# Patient Record
Sex: Male | Born: 1950 | Race: White | Hispanic: No | Marital: Single | Smoking: Former smoker
Health system: Southern US, Community
[De-identification: ages and names within clinical notes are randomized; demographics above are authoritative.]

## PROBLEM LIST (undated history)

## (undated) DIAGNOSIS — I4892 Unspecified atrial flutter: Secondary | ICD-10-CM

## (undated) DIAGNOSIS — I499 Cardiac arrhythmia, unspecified: Secondary | ICD-10-CM

## (undated) DIAGNOSIS — K6282 Dysplasia of anus: Secondary | ICD-10-CM

## (undated) DIAGNOSIS — M1990 Unspecified osteoarthritis, unspecified site: Secondary | ICD-10-CM

## (undated) DIAGNOSIS — L80 Vitiligo: Secondary | ICD-10-CM

## (undated) DIAGNOSIS — I4891 Unspecified atrial fibrillation: Secondary | ICD-10-CM

## (undated) HISTORY — DX: Vitiligo: L80

## (undated) HISTORY — DX: Dysplasia of anus: K62.82

## (undated) HISTORY — PX: ANUS SURGERY: SHX302

---

## 2004-07-07 HISTORY — PX: COLONOSCOPY: SHX174

## 2006-06-19 ENCOUNTER — Ambulatory Visit: Payer: Self-pay | Admitting: Family Medicine

## 2006-10-26 ENCOUNTER — Ambulatory Visit: Payer: Self-pay | Admitting: Family Medicine

## 2007-04-02 ENCOUNTER — Encounter (HOSPITAL_BASED_OUTPATIENT_CLINIC_OR_DEPARTMENT_OTHER): Payer: Self-pay | Admitting: General Surgery

## 2007-04-02 ENCOUNTER — Ambulatory Visit (HOSPITAL_COMMUNITY): Admission: RE | Admit: 2007-04-02 | Discharge: 2007-04-02 | Payer: Self-pay | Admitting: General Surgery

## 2007-08-25 ENCOUNTER — Ambulatory Visit: Payer: Self-pay | Admitting: Family Medicine

## 2008-02-07 ENCOUNTER — Ambulatory Visit: Payer: Self-pay | Admitting: Family Medicine

## 2008-07-07 HISTORY — PX: SIGMOIDOSCOPY: SUR1295

## 2009-08-13 ENCOUNTER — Ambulatory Visit: Payer: Self-pay | Admitting: Family Medicine

## 2010-11-04 ENCOUNTER — Ambulatory Visit (INDEPENDENT_AMBULATORY_CARE_PROVIDER_SITE_OTHER): Payer: BC Managed Care – PPO | Admitting: Family Medicine

## 2010-11-04 DIAGNOSIS — R000 Tachycardia, unspecified: Secondary | ICD-10-CM

## 2010-11-05 ENCOUNTER — Encounter: Payer: Self-pay | Admitting: Family Medicine

## 2010-11-19 NOTE — Op Note (Signed)
NAMEALIZE, ACY               ACCOUNT NO.:  [id number]   MEDICAL RECORD NO.:  [id number]          PATIENT TYPE:  AMB   LOCATION:  DAY                          FACILITY:  [facility]   PHYSICIAN:  Leonie Man, M.D.   DATE OF BIRTH:  1950-08-21   DATE OF PROCEDURE:  04/02/2007  DATE OF DISCHARGE:                               OPERATIVE REPORT   PREOPERATIVE DIAGNOSIS:  Anal intraepithelial neoplasia.   POSTOPERATIVE DIAGNOSIS:  Anal intraepithelial neoplasia.   PROCEDURE:  Excision of anal intraepithelial neoplasia, anal wall at 9  o'clock.   SURGEON:  Leonie Man, M.D.   ASSISTANT:  Sheppard Plumber. Earlene Plater, M.D.   ANESTHESIA:  General.   SURGICAL FINDINGS:  An area of mucosal abnormality consistent with AIN  at the 9 o'clock position with the patient in lithotomy position.   SPECIMENS:  Forwarded to pathology.   ESTIMATED BLOOD LOSS:  minimal.   COMPLICATIONS:  None.   Patient returned to the PACU in excellent condition.   INDICATIONS:  The patient is a 60 year old Caucasian male who on initial  presentation underwent colonoscopy by Dr. Jeani Hawking, who saw  an  abnornal area within the ano-rectum which, on biopsy, showed low-grade  intraepithelial neoplasia.  The patient was followed expectantly and  when he returned in 2008, on repeat flexible sigmoidoscopy he was re-  biopsied and now noted to have an area of high-grade intraepithelial  neoplasia.  The patient comes to the operating room now for wide local  excision of this area after the risks and potential benefits of surgery  have been discussed.  We have also discussed the possibility of using  laser ablation to evaluate this to further treat this area.   PROCEDURE:  The patient is positioned supinely, then placed in lithotomy  position.  Perianal tissues are prepped and draped to be included in a  sterile operative field.  A perianal block is carried out with Marcaine  with epinephrine and the anal orifice was  dilated up to 3  fingerbreadths.  The scope is placed in and at the region at 9 o'clock  with the patient in lithotomy, there was an area of what appeared to be  dysplastic.  This area was infiltrated with Marcaine with epinephrine to  raise the mucosa up and then using electrocautery, an area of mucosa was  taken out and dissected free from the underlying internal sphincter  muscles and removed in its entirety and forwarded for pathologic  evaluation.  The hemostasis was controlled with electrocautery and the  resulting incision closed with a running suture of 2-0 chromic catgut.  There was no bleeding at the end of this; however, I did place a Gelfoam  plug  within the anus for additional hemostasis.  Sponge and instrument counts  were verified, the anesthetic reversed and the patient removed from the  operating room to the recovery room in stable condition.  He tolerated  the procedure well.      Leonie Man, M.D.  Electronically Signed     PB/MEDQ  D:  04/02/2007  T:  04/03/2007  Job:  [id number] 

## 2010-11-25 ENCOUNTER — Ambulatory Visit (INDEPENDENT_AMBULATORY_CARE_PROVIDER_SITE_OTHER): Payer: BC Managed Care – PPO | Admitting: Family Medicine

## 2010-11-25 ENCOUNTER — Encounter: Payer: Self-pay | Admitting: Family Medicine

## 2010-11-25 VITALS — BP 122/90 | HR 54 | Ht 73.2 in | Wt 179.0 lb

## 2010-11-25 DIAGNOSIS — K6282 Dysplasia of anus: Secondary | ICD-10-CM

## 2010-11-25 DIAGNOSIS — K922 Gastrointestinal hemorrhage, unspecified: Secondary | ICD-10-CM

## 2010-11-25 DIAGNOSIS — Z0000 Encounter for general adult medical examination without abnormal findings: Secondary | ICD-10-CM

## 2010-11-25 LAB — POCT URINALYSIS DIPSTICK
Leukocytes, UA: NEGATIVE
Nitrite, UA: NEGATIVE
Protein, UA: NEGATIVE
Urobilinogen, UA: NEGATIVE
pH, UA: 7

## 2010-11-25 NOTE — Progress Notes (Signed)
  Subjective:    Patient ID: Kenneth Romero, male    DOB: 1951-04-17, 60 y.o.   MRN: [id number]  HPI he is here for a complete examination. He recently had an echocardiogram done to evaluate his one episode of tachycardia. He continues to have slight ringing in his ears but finds that it does not bother him enough that pursue further testing. He continues to have nocturia x1 and is comfortable with this. He continues on multiple medications mainly over-the-counter. These were discussed with him in detail. His social and family history were reviewed. Continues to work and this seems to be going well. 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   HENT: Negative.   Eyes: Negative.   Respiratory: Negative.   Cardiovascular: Negative.   Gastrointestinal: Negative.   Musculoskeletal: Negative.   Skin: Negative.   Neurological: Negative.   Psychiatric/Behavioral: Negative.        Objective:   Physical ExamBP 122/90  Pulse 54  Ht 6' 1.2" (1.859 m)  Wt 179 lb (81.194 kg)  BMI 23.49 kg/m2  General Appearance:    Alert, cooperative, no distress, appears stated age  Head:    Normocephalic, without obvious abnormality, atraumatic  Eyes:    PERRL, conjunctiva/corneas clear, EOM's intact, fundi    benign  Ears:    Normal TM's and external ear canals  Nose:   Nares normal, mucosa normal, no drainage or sinus   tenderness  Throat:   Lips, mucosa, and tongue normal; teeth and gums normal  Neck:   Supple, no lymphadenopathy;  thyroid:  no   enlargement/tenderness/nodules; no carotid   bruit or JVD  Back:    Spine nontender, no curvature, ROM normal, no CVA     tenderness  Lungs:     Clear to auscultation bilaterally without wheezes, rales or     ronchi; respirations unlabored  Chest Wall:    No tenderness or deformity   Heart:    Regular rate and rhythm, S1 and S2 normal, no murmur, rub   or gallop  Breast Exam:    No chest wall tenderness,  masses or gynecomastia  Abdomen:     Soft, non-tender, nondistended, normoactive bowel sounds,    no masses, no hepatosplenomegaly  Genitalia:    Normal male external genitalia without lesions.  Testicles without masses.  No inguinal hernias.  Rectal:    Normal sphincter tone, no masses or tenderness; guaiac positive stool.  Prostate smooth, no nodules, not enlarged.  Extremities:   No clubbing, cyanosis or edema  Pulses:   2+ and symmetric all extremities  Skin:   Skin color, texture, turgor normal, no rashes or lesions  Lymph nodes:   Cervical, supraclavicular, and axillary nodes normal  Neurologic:   CNII-XII intact, normal strength, sensation and gait; reflexes 2+ and symmetric throughout          Psych:   Normal mood, affect, hygiene and grooming.     The echocardiogram was reviewed and is negative       Assessment & Plan:  One episode of tachycardia. AIN1 with guaiac-positive stool A message was left with Dr. Elnoria Howard to call me concerning followup on this. Also discussed multiple over-the-counter medications he is on an increase to use a multivitamin as well as vitamin D and omega-3.

## 2010-11-25 NOTE — Patient Instructions (Signed)
If you have further trouble with heart rate speeding up call me for further evaluation.

## 2011-04-17 LAB — CBC
Platelets: 228
RDW: 12.8

## 2011-04-17 LAB — COMPREHENSIVE METABOLIC PANEL
AST: 26
Albumin: 4.1
Alkaline Phosphatase: 51
BUN: 10
GFR calc Af Amer: >60
Potassium: 4
Sodium: 140
Total Protein: 5.8 — ABNORMAL LOW

## 2011-04-17 LAB — DIFFERENTIAL
Basophils Relative: 1
Monocytes Absolute: 0.4
Monocytes Relative: 9
Neutro Abs: 2.9

## 2011-05-16 ENCOUNTER — Ambulatory Visit (INDEPENDENT_AMBULATORY_CARE_PROVIDER_SITE_OTHER): Payer: BC Managed Care – PPO | Admitting: Family Medicine

## 2011-05-16 ENCOUNTER — Encounter: Payer: Self-pay | Admitting: Family Medicine

## 2011-05-16 VITALS — BP 132/82 | HR 66 | Temp 98.4°F | Wt 182.0 lb

## 2011-05-16 DIAGNOSIS — J209 Acute bronchitis, unspecified: Secondary | ICD-10-CM

## 2011-05-16 MED ORDER — AMOXICILLIN ER 775 MG PO TB24: 775.0000 mg | ORAL_TABLET | Freq: Every day | ORAL | Status: DC

## 2011-05-16 NOTE — Progress Notes (Signed)
  Subjective:    Patient ID: Kenneth Romero, male    DOB: 08-23-1950, 60 y.o.   MRN: [id number]  HPI He has a two-week history of started with a sore throat followed by dry cough is now intermittently productive. He's had some rhinorrhea but no earache or sinus congestion. No fever or chills. He does not smoke. He continues on medications listed in the chart.   Review of Systems     Objective:   Physical Exam alert and in no distress. Tympanic membranes and canals are normal. Throat is clear. Tonsils are normal. Neck is supple without adenopathy or thyromegaly. Cardiac exam shows a regular sinus rhythm without murmurs or gallops. Lungs are clear to auscultation.        Assessment & Plan:  Bronchitis I will treat him with Moxatag. He is to call if not better.

## 2011-05-16 NOTE — Patient Instructions (Signed)
Tylenol or Advil for aches and pains and try NyQuil at night to help with sleeping and coughing. If you are not fully back to normal when you finish this give me a call

## 2011-05-26 ENCOUNTER — Other Ambulatory Visit: Payer: Self-pay | Admitting: Internal Medicine

## 2011-05-26 ENCOUNTER — Telehealth: Payer: Self-pay | Admitting: Family Medicine

## 2011-05-26 MED ORDER — AMOXICILLIN ER 775 MG PO TB24: 775.0000 mg | ORAL_TABLET | Freq: Every day | ORAL | Status: AC

## 2011-05-26 NOTE — Telephone Encounter (Signed)
Call in the antibiotic for him. I think he was switched to Amoxil but check

## 2011-05-26 NOTE — Telephone Encounter (Signed)
Refilled the moxatag 775mg  24 hr, thru [facility]. #10 tab no refills.

## 2011-05-26 NOTE — Telephone Encounter (Signed)
Pt called he is 50% better but wants another round of antibiotics called to [facility].

## 2011-05-26 NOTE — Telephone Encounter (Signed)
[facility] it in moxatag 775mg  24 hr. #10. No refills

## 2012-04-30 ENCOUNTER — Encounter: Payer: Self-pay | Admitting: Internal Medicine

## 2012-05-07 ENCOUNTER — Ambulatory Visit (INDEPENDENT_AMBULATORY_CARE_PROVIDER_SITE_OTHER): Payer: BC Managed Care – PPO | Admitting: Family Medicine

## 2012-05-07 ENCOUNTER — Encounter: Payer: Self-pay | Admitting: Family Medicine

## 2012-05-07 VITALS — BP 120/80 | HR 58 | Ht 73.0 in | Wt 175.0 lb

## 2012-05-07 DIAGNOSIS — Z23 Encounter for immunization: Secondary | ICD-10-CM

## 2012-05-07 DIAGNOSIS — K6282 Dysplasia of anus: Secondary | ICD-10-CM | POA: Insufficient documentation

## 2012-05-07 DIAGNOSIS — Z0000 Encounter for general adult medical examination without abnormal findings: Secondary | ICD-10-CM

## 2012-05-07 LAB — LIPID PANEL
HDL: 54 mg/dL (ref >39)
LDL Cholesterol: 95 mg/dL (ref 0–99)
Triglycerides: 84 mg/dL (ref <150)
VLDL: 17 mg/dL (ref 0–40)

## 2012-05-07 LAB — CBC WITH DIFFERENTIAL/PLATELET
Basophils Absolute: 0 10*3/uL (ref 0.0–0.1)
Basophils Relative: 0 % (ref 0–1)
Hemoglobin: 15.1 g/dL (ref 13.0–17.0)
MCHC: 34.6 g/dL (ref 30.0–36.0)
Neutro Abs: 3.4 10*3/uL (ref 1.7–7.7)
Neutrophils Relative %: 59 % (ref 43–77)
RDW: 13.1 % (ref 11.5–15.5)

## 2012-05-07 LAB — COMPREHENSIVE METABOLIC PANEL
AST: 22 U/L (ref 0–37)
Albumin: 4.3 g/dL (ref 3.5–5.2)
Alkaline Phosphatase: 52 U/L (ref 39–117)
Potassium: 4.2 mEq/L (ref 3.5–5.3)
Sodium: 140 mEq/L (ref 135–145)
Total Protein: 6 g/dL (ref 6.0–8.3)

## 2012-05-07 NOTE — Progress Notes (Signed)
  Subjective:    Patient ID: Kenneth Romero, male    DOB: 1951-04-30, 61 y.o.   MRN: [id number]  HPI He is here for complete examination. He has noted more arthritic symptoms of stiffness especially in the morning but does find that you have does help with this. He gets followup on his eyes and apparently was told he might have early signs of cataract. He also gets regular followup on his AIN1. His work continues to go well. He does exercise regularly.   Review of Systems  Constitutional: Negative.   HENT: Negative.   Eyes: Negative.   Respiratory: Negative.   Cardiovascular: Negative.   Gastrointestinal: Negative.   Genitourinary: Negative.   Musculoskeletal: Negative.   Skin: Negative.   Neurological: Negative.   Hematological: Negative.   Psychiatric/Behavioral: Negative.        Objective:   Physical Exam BP 120/80  Pulse 58  Ht 6\' 1"  (1.854 m)  Wt 175 lb (79.379 kg)  BMI 23.09 kg/m2  General Appearance:    Alert, cooperative, no distress, appears stated age  Head:    Normocephalic, without obvious abnormality, atraumatic  Eyes:    PERRL, conjunctiva/corneas clear, EOM's intact, fundi    benign  Ears:    Normal TM's and external ear canals  Nose:   Nares normal, mucosa normal, no drainage or sinus   tenderness  Throat:   Lips, mucosa, and tongue normal; teeth and gums normal  Neck:   Supple, no lymphadenopathy;  thyroid:  no   enlargement/tenderness/nodules; no carotid   bruit or JVD  Back:    Spine nontender, no curvature, ROM normal, no CVA     tenderness  Lungs:     Clear to auscultation bilaterally without wheezes, rales or     ronchi; respirations unlabored  Chest Wall:    No tenderness or deformity   Heart:    Regular rate and rhythm, S1 and S2 normal, no murmur, rub   or gallop  Breast Exam:    No chest wall tenderness, masses or gynecomastia  Abdomen:     Soft, non-tender, nondistended, normoactive bowel sounds,    no masses, no hepatosplenomegaly  Genitalia:     Normal male external genitalia without lesions.  Testicles without masses.  No inguinal hernias.  Rectal:    deferred   Extremities:   No clubbing, cyanosis or edema  Pulses:   2+ and symmetric all extremities  Skin:   Skin color, texture, turgor normal, no rashes or lesions  Lymph nodes:   Cervical, supraclavicular, and axillary nodes normal  Neurologic:   CNII-XII intact, normal strength, sensation and gait; reflexes 2+ and symmetric throughout          Psych:   Normal mood, affect, hygiene and grooming.           Assessment & Plan:   1. Routine general medical examination at a health care facility  Flu vaccine greater than or equal to 3yo preservative free IM, Lipid panel, CBC with Differential, Comprehensive metabolic panel  2. AIN grade I     flu shot given with instructions on risk and benefits. I encouraged him to continue to take good care of himself. We also discussed retirement and he has no plans at this time. So discussed advanced directive.

## 2012-05-08 NOTE — Progress Notes (Signed)
Quick Note:  The blood work is normal ______ 

## 2012-09-13 ENCOUNTER — Ambulatory Visit
Admission: RE | Admit: 2012-09-13 | Discharge: 2012-09-13 | Disposition: A | Discharge status: Home / Self Care | Payer: BC Managed Care – PPO | Source: Ambulatory Visit | Attending: Family Medicine | Admitting: Family Medicine

## 2012-09-13 ENCOUNTER — Encounter: Payer: Self-pay | Admitting: Family Medicine

## 2012-09-13 ENCOUNTER — Ambulatory Visit (INDEPENDENT_AMBULATORY_CARE_PROVIDER_SITE_OTHER): Payer: BC Managed Care – PPO | Admitting: Family Medicine

## 2012-09-13 VITALS — BP 118/80 | HR 68 | Wt 174.0 lb

## 2012-09-13 DIAGNOSIS — M25519 Pain in unspecified shoulder: Secondary | ICD-10-CM

## 2012-09-13 DIAGNOSIS — S0121XA Laceration without foreign body of nose, initial encounter: Secondary | ICD-10-CM

## 2012-09-13 DIAGNOSIS — M25512 Pain in left shoulder: Secondary | ICD-10-CM

## 2012-09-13 DIAGNOSIS — S0120XA Unspecified open wound of nose, initial encounter: Secondary | ICD-10-CM

## 2012-09-13 NOTE — Patient Instructions (Signed)
I will call you with the results of the xray 

## 2012-09-13 NOTE — Progress Notes (Signed)
  Subjective:    Patient ID: Kenneth Romero, male    DOB: March 19, 1951, 62 y.o.   MRN: [id number]  HPI He has a one-month history of left shoulder pain that he notices especially when he abducts and externally rotates. He also wakes up at night with this. He has no history of injury to the shoulder. He is right-handed. He does note occasional snapping.he is a professor at [employer]. He also sustained a laceration to the left bridge of the nose from his cat.   Review of Systems     Objective:   Physical Exam 1 CM laceration is noted to the bridge of the nose on the left. It is not red or tender. Left shoulder exam shows no palpable pain but pain on abduction and external rotation. Supraspinatus testing was negative. No laxity noted. Hawkins and Neer's test did cause a clicking sensation.       Assessment & Plan:  Nasal laceration, initial encounter  Left shoulder pain - Plan: DG Shoulder Left  Conservative care for the nasal laceration. I will wait the results of the x-ray. Discussed possible orthopedic referral especially since he has a clicking sensation.

## 2012-09-13 NOTE — Progress Notes (Signed)
Quick Note:  Let him know that he has degenerative changes in his shoulder and I want him to see an orthopedic surgeon. Set that up ______

## 2012-09-17 ENCOUNTER — Encounter: Payer: Self-pay | Admitting: Family Medicine

## 2012-09-21 ENCOUNTER — Encounter: Payer: Self-pay | Admitting: Family Medicine

## 2012-09-21 ENCOUNTER — Ambulatory Visit
Admission: RE | Admit: 2012-09-21 | Discharge: 2012-09-21 | Disposition: A | Discharge status: Home / Self Care | Payer: BC Managed Care – PPO | Source: Ambulatory Visit | Attending: Family Medicine | Admitting: Family Medicine

## 2012-09-21 ENCOUNTER — Ambulatory Visit (INDEPENDENT_AMBULATORY_CARE_PROVIDER_SITE_OTHER): Payer: BC Managed Care – PPO | Admitting: Family Medicine

## 2012-09-21 VITALS — BP 110/70 | HR 66 | Wt 177.0 lb

## 2012-09-21 DIAGNOSIS — M25519 Pain in unspecified shoulder: Secondary | ICD-10-CM

## 2012-09-21 NOTE — Progress Notes (Signed)
  Subjective:    Patient ID: Kenneth Romero, male    DOB: Feb 13, 1951, 62 y.o.   MRN: [id number]  HPI Is here for consult concerning continued difficulty with shoulder pain. Now apparently he also has difficulty with the right shoulder. Was difficult to determine when it bothers him but it does cause him more trouble at night. He initially stated lying flat on his back causes pain but then stated that he was sometimes be awakened with pain in not be flat on his back. 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 He is set up to see orthopedics for evaluation of degenerative changes in his left shoulder.   Review of Systems     Objective:   Physical Exam No tenderness to palpation of the right shoulder. No laxity noted. Range of motion shows slight limitation with external rotation and abduction.       Assessment & Plan:  Shoulder pain, unspecified laterality - Plan: DG Shoulder Right x-rays were ordered. I recommended that he also discussed this with the orthopedic surgeon who he plans to see it in the month.

## 2012-09-22 NOTE — Progress Notes (Signed)
Quick Note:  CALLED TO INFORM PT OF X-RAY LEFT MESSAGE TO CALL ME BACK ______

## 2012-09-22 NOTE — Progress Notes (Signed)
Quick Note:  Pt informed verbalized understanding  ______

## 2013-05-12 ENCOUNTER — Other Ambulatory Visit: Payer: Self-pay

## 2013-11-04 ENCOUNTER — Ambulatory Visit (INDEPENDENT_AMBULATORY_CARE_PROVIDER_SITE_OTHER): Payer: BC Managed Care – PPO | Admitting: Family Medicine

## 2013-11-04 ENCOUNTER — Telehealth: Payer: Self-pay | Admitting: *Deleted

## 2013-11-04 ENCOUNTER — Encounter: Payer: Self-pay | Admitting: Family Medicine

## 2013-11-04 VITALS — BP 140/90 | HR 52 | Resp 16 | Wt 178.0 lb

## 2013-11-04 DIAGNOSIS — Z23 Encounter for immunization: Secondary | ICD-10-CM

## 2013-11-04 DIAGNOSIS — Z0000 Encounter for general adult medical examination without abnormal findings: Secondary | ICD-10-CM

## 2013-11-04 DIAGNOSIS — L989 Disorder of the skin and subcutaneous tissue, unspecified: Secondary | ICD-10-CM

## 2013-11-04 DIAGNOSIS — K6282 Dysplasia of anus: Secondary | ICD-10-CM

## 2013-11-04 DIAGNOSIS — H409 Unspecified glaucoma: Secondary | ICD-10-CM

## 2013-11-04 LAB — POCT URINALYSIS DIPSTICK
BILIRUBIN UA: NEGATIVE
Blood, UA: NEGATIVE
GLUCOSE UA: NEGATIVE
KETONES UA: NEGATIVE
LEUKOCYTES UA: NEGATIVE
NITRITE UA: NEGATIVE
PH UA: 8
Protein, UA: NEGATIVE
Spec Grav, UA: <=1.005
Urobilinogen, UA: NEGATIVE

## 2013-11-04 LAB — COMPREHENSIVE METABOLIC PANEL
ALBUMIN: 4.5 g/dL (ref 3.5–5.2)
ALK PHOS: 64 U/L (ref 39–117)
ALT: 17 U/L (ref 0–53)
AST: 19 U/L (ref 0–37)
BUN: 14 mg/dL (ref 6–23)
CALCIUM: 9.1 mg/dL (ref 8.4–10.5)
CHLORIDE: 102 meq/L (ref 96–112)
CO2: 28 mEq/L (ref 19–32)
Creat: 0.7 mg/dL (ref 0.50–1.35)
Glucose, Bld: 91 mg/dL (ref 70–99)
POTASSIUM: 4 meq/L (ref 3.5–5.3)
SODIUM: 137 meq/L (ref 135–145)
TOTAL PROTEIN: 6.3 g/dL (ref 6.0–8.3)
Total Bilirubin: 0.8 mg/dL (ref 0.2–1.2)

## 2013-11-04 LAB — CBC WITH DIFFERENTIAL/PLATELET
BASOS ABS: 0 10*3/uL (ref 0.0–0.1)
BASOS PCT: 0 % (ref 0–1)
Eosinophils Absolute: 0.2 10*3/uL (ref 0.0–0.7)
Eosinophils Relative: 4 % (ref 0–5)
HCT: 45.1 % (ref 39.0–52.0)
HEMOGLOBIN: 15.7 g/dL (ref 13.0–17.0)
LYMPHS PCT: 28 % (ref 12–46)
Lymphs Abs: 1.5 10*3/uL (ref 0.7–4.0)
MCH: 31.4 pg (ref 26.0–34.0)
MCHC: 34.8 g/dL (ref 30.0–36.0)
MCV: 90.2 fL (ref 78.0–100.0)
MONOS PCT: 11 % (ref 3–12)
Monocytes Absolute: 0.6 10*3/uL (ref 0.1–1.0)
NEUTROS ABS: 3.1 10*3/uL (ref 1.7–7.7)
NEUTROS PCT: 57 % (ref 43–77)
Platelets: 209 10*3/uL (ref 150–400)
RBC: 5 MIL/uL (ref 4.22–5.81)
RDW: 13.6 % (ref 11.5–15.5)
WBC: 5.5 10*3/uL (ref 4.0–10.5)

## 2013-11-04 LAB — LIPID PANEL
Cholesterol: 185 mg/dL (ref 0–200)
HDL: 63 mg/dL (ref >39)
LDL CALC: 109 mg/dL — AB (ref 0–99)
Total CHOL/HDL Ratio: 2.9 Ratio
Triglycerides: 63 mg/dL (ref <150)
VLDL: 13 mg/dL (ref 0–40)

## 2013-11-04 NOTE — Telephone Encounter (Signed)
Patient notified of his upcoming appointment with Dr. Benson Norway on 11-09-13 at 11:00am for sigmoidoscopy. His labs and office notes need to be sent once they are back. 11-04-13 jk

## 2013-11-04 NOTE — Progress Notes (Signed)
   Subjective:    Patient ID: Kenneth Romero, male    DOB: 1950/08/16, 63 y.o.   MRN: [id number]  HPI He is here for complete examination. He has enjoyed excellent health. He is on multiple OTC meds. He also uses eyedrops for his underlying glaucoma and is being followed for this. He keeps himself active. Smoking and drinking were reviewed. 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 He has no plans to retire. He has a previous history of AIN-1 with surgery in 2008. His last colonoscopy was 2012. He does have a lesion on his right upper chest that he would like evaluated. Otherwise he has no particular concerns or complaints.   Review of Systems  All other systems reviewed and are negative.      Objective:   Physical Exam BP 140/90  Pulse 52  Resp 16  Wt 178 lb (80.74 kg)  General Appearance:    Alert, cooperative, no distress, appears stated age  Head:    Normocephalic, without obvious abnormality, atraumatic  Eyes:    PERRL, conjunctiva/corneas clear, EOM's intact, fundi    benign  Ears:    Normal TM's and external ear canals  Nose:   Nares normal, mucosa normal, no drainage or sinus   tenderness  Throat:   Lips, mucosa, and tongue normal; teeth and gums normal  Neck:   Supple, no lymphadenopathy;  thyroid:  no   enlargement/tenderness/nodules; no carotid   bruit or JVD  Back:    Spine nontender, no curvature, ROM normal, no CVA     tenderness  Lungs:     Clear to auscultation bilaterally without wheezes, rales or     ronchi; respirations unlabored  Chest Wall:    No tenderness or deformity   Heart:    Regular rate and rhythm, S1 and S2 normal, no murmur, rub   or gallop  Breast Exam:    No chest wall tenderness, masses or gynecomastia  Abdomen:     Soft, non-tender, nondistended, normoactive bowel sounds,    no masses, no hepatosplenomegaly  Genitalia:  Deferred  Rectal:  deferred  Extremities:   No clubbing, cyanosis or edema  Pulses:   2+ and  symmetric all extremities  Skin:   Skin color, texture, turgor normal, slightly erythematous linear lesion of approximately 2 cm noted on the right anterior chest near the clavicle midportion. The vascularity is slightly irregular.   Lymph nodes:   Cervical, supraclavicular, and axillary nodes normal  Neurologic:   CNII-XII intact, normal strength, sensation and gait; reflexes 2+ and symmetric throughout          Psych:   Normal mood, affect, hygiene and grooming.          Assessment & Plan:  Routine general medical examination at a health care facility - Plan: Urinalysis Dipstick, Tdap vaccine greater than or equal to 7yo IM, CBC with Differential, Comprehensive metabolic panel, Lipid panel  Anal intraepithelial neoplasia I (AIN I) - Plan: Ambulatory referral to Gastroenterology  Skin lesion of chest wall  he will return for a punch biopsy.

## 2013-11-04 NOTE — Telephone Encounter (Signed)
Error

## 2013-11-08 ENCOUNTER — Ambulatory Visit: Payer: Self-pay | Admitting: Family Medicine

## 2013-11-15 ENCOUNTER — Encounter: Payer: Self-pay | Admitting: Family Medicine

## 2013-11-15 ENCOUNTER — Other Ambulatory Visit: Payer: Self-pay | Admitting: Family Medicine

## 2013-11-15 ENCOUNTER — Ambulatory Visit (INDEPENDENT_AMBULATORY_CARE_PROVIDER_SITE_OTHER): Payer: BC Managed Care – PPO | Admitting: Family Medicine

## 2013-11-15 VITALS — BP 128/78 | HR 64 | Wt 179.0 lb

## 2013-11-15 DIAGNOSIS — L989 Disorder of the skin and subcutaneous tissue, unspecified: Secondary | ICD-10-CM

## 2013-11-15 NOTE — Progress Notes (Signed)
   Subjective:    Patient ID: Kenneth Romero, male    DOB: September 25, 1950, 63 y.o.   MRN: [id number]  HPI He is here for punch biopsy of the skin lesion in the right upper chest just distal to the mid clavicle area.  Review of Systems     Objective:   Physical Exam An oval-shaped slightly pinkish lesion with irregularity is noted in this area. It was injected with Xylocaine and epinephrine.       Assessment & Plan:  Skin lesion of chest wall  a 2 mm punch biopsy was taken.

## 2013-11-17 ENCOUNTER — Encounter: Payer: Self-pay | Admitting: Family Medicine

## 2013-11-30 ENCOUNTER — Encounter: Payer: Self-pay | Admitting: Family Medicine

## 2013-11-30 ENCOUNTER — Other Ambulatory Visit: Payer: Self-pay | Admitting: Family Medicine

## 2013-11-30 ENCOUNTER — Ambulatory Visit (INDEPENDENT_AMBULATORY_CARE_PROVIDER_SITE_OTHER): Payer: BC Managed Care – PPO | Admitting: Family Medicine

## 2013-11-30 DIAGNOSIS — C44519 Basal cell carcinoma of skin of other part of trunk: Secondary | ICD-10-CM

## 2013-11-30 NOTE — Progress Notes (Signed)
   Subjective:    Patient ID: Kenneth Romero, male    DOB: March 10, 1951, 63 y.o.   MRN: [id number]  HPI He is here for excision of a basal cell carcinoma of the right upper chest. The lesion is approximately 1 x 2 cm in size.   Review of Systems     Objective:   Physical Exam 1 x 2 cm erythematous lesion is noted in the midportion of the right clavicle.       Assessment & Plan:  Basal cell carcinoma of anterior chest  the area was injected with Xylocaine and epinephrine. An elliptical excision was accomplished without difficulty. 5 5-0 Ethilon sutures were applied. He is to turn here in one week for removal.

## 2013-12-07 ENCOUNTER — Ambulatory Visit: Payer: BC Managed Care – PPO | Admitting: Family Medicine

## 2013-12-07 ENCOUNTER — Ambulatory Visit (INDEPENDENT_AMBULATORY_CARE_PROVIDER_SITE_OTHER): Payer: BC Managed Care – PPO | Admitting: Family Medicine

## 2013-12-07 DIAGNOSIS — Z4802 Encounter for removal of sutures: Secondary | ICD-10-CM

## 2013-12-07 NOTE — Progress Notes (Signed)
   Subjective:    Patient ID: Kenneth Romero, male    DOB: [birth year], 63 y.o.   MRN: [id number]  HPI He is here for suture removal. Review his record indicates that the cancer was removed with free margins.   Review of Systems     Objective:   Physical Exam One suture did fall out. The other 3 were removed. He does have evidence of a tissue reaction from the sutures.       Assessment & Plan:   suture removal. I reassured him that the erythema will diminish with time.

## 2014-09-20 IMAGING — CR DG SHOULDER 2+V*R*
3 series · 3 of 3 positions shown · non-contrast
Comparison: None.

CLINICAL DATA: Chronic right shoulder pain.

RIGHT SHOULDER - 2+ VIEW

[view not recorded (1 of 3)]
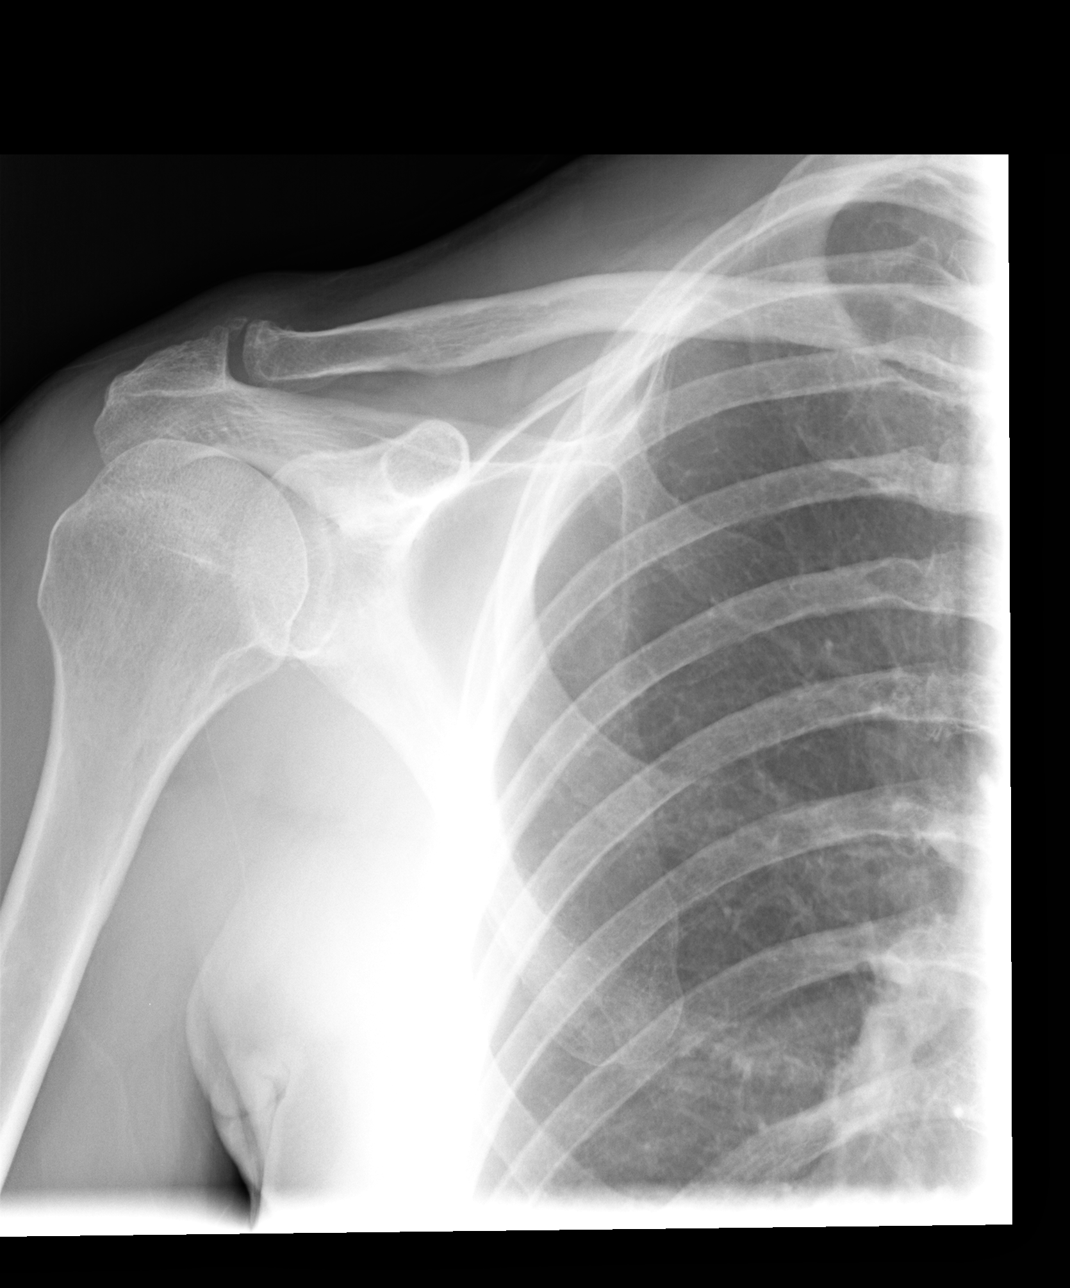

[view not recorded (2 of 3)]
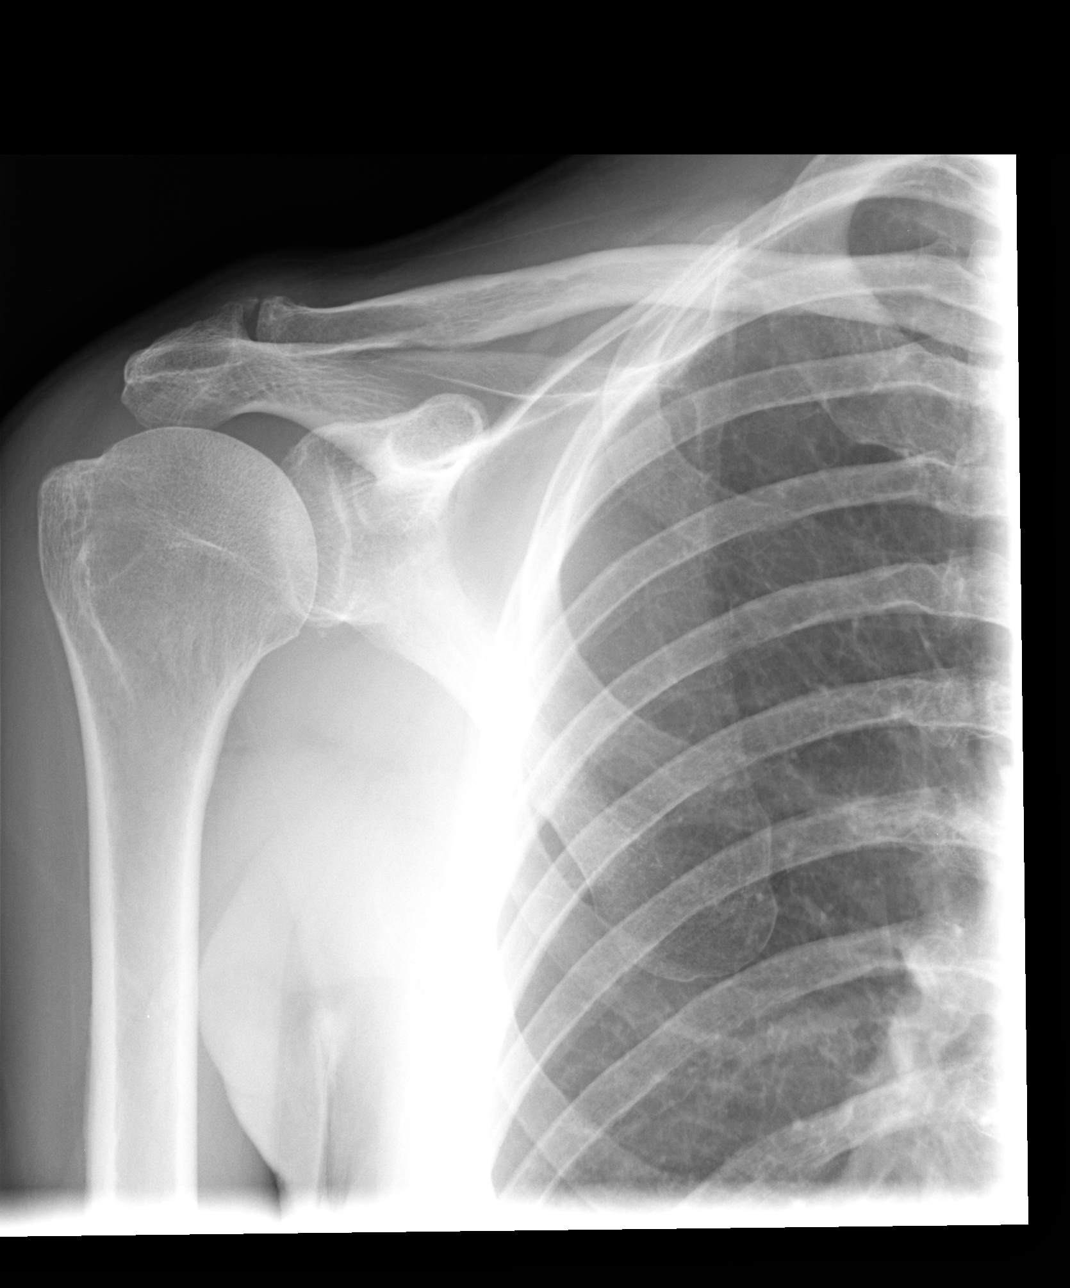

[view not recorded (3 of 3)]
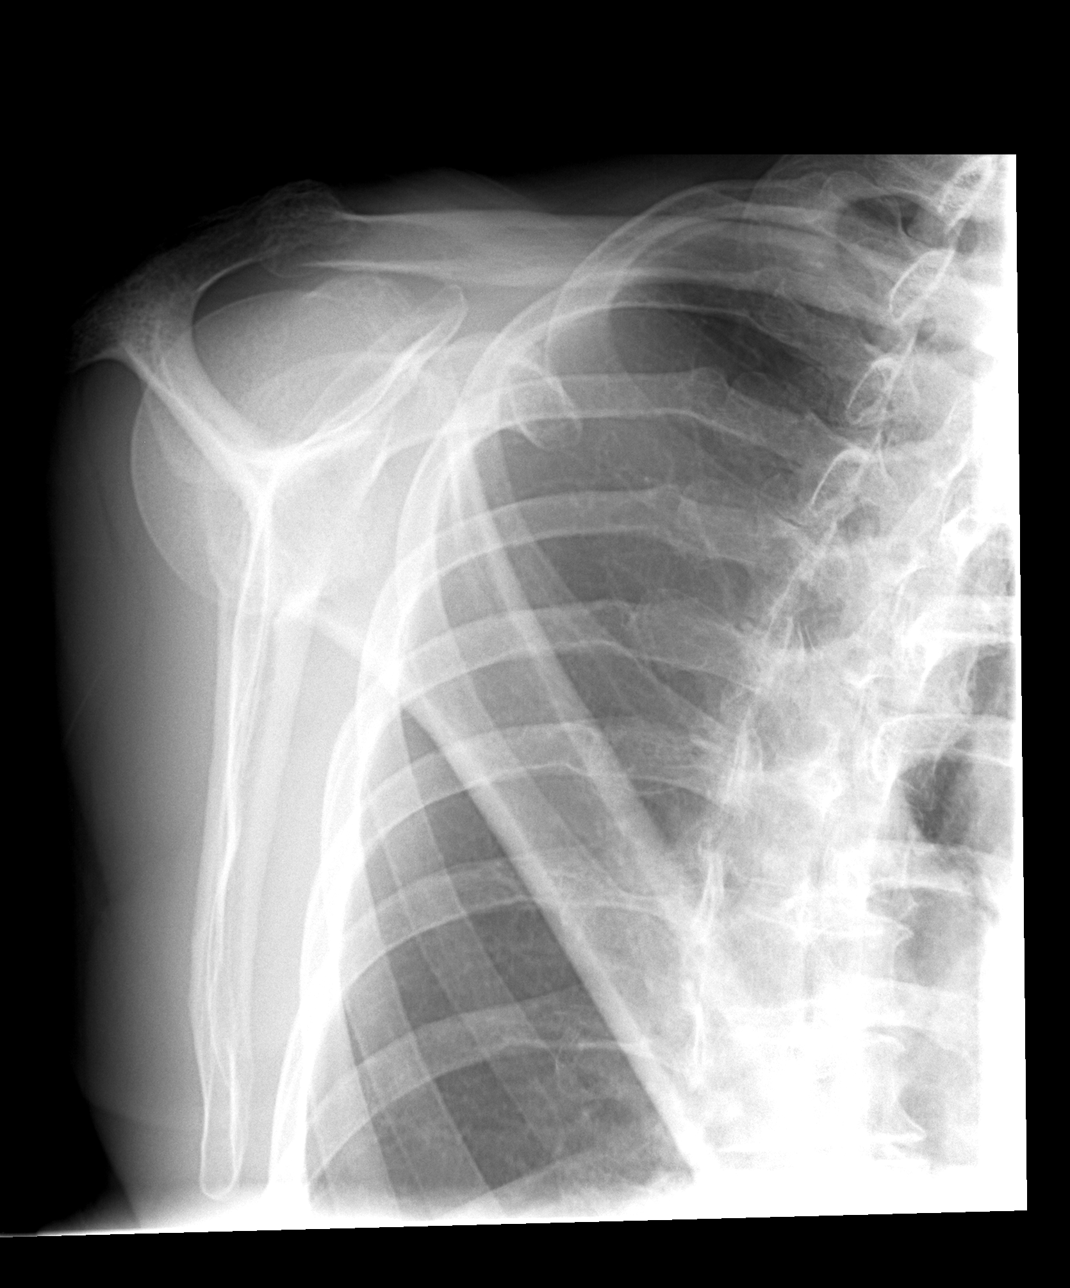

[3 of 3 positions shown; findings below may reference images not displayed]

FINDINGS: The right shoulder is located.  Tiny calcification is
present inferior to the glenoid, probably representing a small
labral calcification.  Mild AC joint osteoarthritis is present with
calcification of the cartilage disc.  Internal and external
rotation views appear normal.  The glenohumeral joint spaces
preserved.  Visualized right chest appears normal.
IMPRESSION: Mild AC joint osteoarthritis.

## 2014-12-26 ENCOUNTER — Encounter: Payer: Self-pay | Admitting: Family Medicine

## 2014-12-26 ENCOUNTER — Ambulatory Visit (INDEPENDENT_AMBULATORY_CARE_PROVIDER_SITE_OTHER): Payer: BC Managed Care – PPO | Admitting: Family Medicine

## 2014-12-26 VITALS — BP 116/74 | HR 58 | Ht 73.0 in | Wt 178.0 lb

## 2014-12-26 DIAGNOSIS — K5900 Constipation, unspecified: Secondary | ICD-10-CM

## 2014-12-26 DIAGNOSIS — Z23 Encounter for immunization: Secondary | ICD-10-CM | POA: Diagnosis not present

## 2014-12-26 DIAGNOSIS — H409 Unspecified glaucoma: Secondary | ICD-10-CM | POA: Diagnosis not present

## 2014-12-26 DIAGNOSIS — Z0000 Encounter for general adult medical examination without abnormal findings: Secondary | ICD-10-CM

## 2014-12-26 DIAGNOSIS — Q439 Congenital malformation of intestine, unspecified: Secondary | ICD-10-CM | POA: Diagnosis not present

## 2014-12-26 DIAGNOSIS — Z125 Encounter for screening for malignant neoplasm of prostate: Secondary | ICD-10-CM | POA: Diagnosis not present

## 2014-12-26 DIAGNOSIS — K6282 Dysplasia of anus: Secondary | ICD-10-CM

## 2014-12-26 DIAGNOSIS — H6122 Impacted cerumen, left ear: Secondary | ICD-10-CM

## 2014-12-26 LAB — LIPID PANEL
CHOL/HDL RATIO: 3 ratio
Cholesterol: 172 mg/dL (ref 0–200)
HDL: 57 mg/dL (ref >=40)
LDL CALC: 96 mg/dL (ref 0–99)
Triglycerides: 97 mg/dL (ref <150)
VLDL: 19 mg/dL (ref 0–40)

## 2014-12-26 LAB — CBC WITH DIFFERENTIAL/PLATELET
BASOS PCT: 0 % (ref 0–1)
Basophils Absolute: 0 10*3/uL (ref 0.0–0.1)
EOS ABS: 0.1 10*3/uL (ref 0.0–0.7)
EOS PCT: 2 % (ref 0–5)
HCT: 44.3 % (ref 39.0–52.0)
Hemoglobin: 15.2 g/dL (ref 13.0–17.0)
Lymphocytes Relative: 33 % (ref 12–46)
Lymphs Abs: 1.7 10*3/uL (ref 0.7–4.0)
MCH: 31.3 pg (ref 26.0–34.0)
MCHC: 34.3 g/dL (ref 30.0–36.0)
MCV: 91.3 fL (ref 78.0–100.0)
MONOS PCT: 9 % (ref 3–12)
MPV: 9.6 fL (ref 8.6–12.4)
Monocytes Absolute: 0.5 10*3/uL (ref 0.1–1.0)
NEUTROS PCT: 56 % (ref 43–77)
Neutro Abs: 2.8 10*3/uL (ref 1.7–7.7)
Platelets: 219 10*3/uL (ref 150–400)
RBC: 4.85 MIL/uL (ref 4.22–5.81)
RDW: 13.1 % (ref 11.5–15.5)
WBC: 5 10*3/uL (ref 4.0–10.5)

## 2014-12-26 LAB — COMPREHENSIVE METABOLIC PANEL
ALBUMIN: 4.4 g/dL (ref 3.5–5.2)
ALT: 23 U/L (ref 0–53)
AST: 26 U/L (ref 0–37)
Alkaline Phosphatase: 48 U/L (ref 39–117)
BUN: 12 mg/dL (ref 6–23)
CALCIUM: 9.2 mg/dL (ref 8.4–10.5)
CHLORIDE: 103 meq/L (ref 96–112)
CO2: 23 mEq/L (ref 19–32)
Creat: 0.61 mg/dL (ref 0.50–1.35)
GLUCOSE: 89 mg/dL (ref 70–99)
POTASSIUM: 4.2 meq/L (ref 3.5–5.3)
Sodium: 138 mEq/L (ref 135–145)
Total Bilirubin: 1.2 mg/dL (ref 0.2–1.2)
Total Protein: 6.3 g/dL (ref 6.0–8.3)

## 2014-12-26 NOTE — Patient Instructions (Signed)
Fluids,Bulk in your diet, exercise, listen to your body

## 2014-12-26 NOTE — Progress Notes (Signed)
   Subjective:    Patient ID: Kenneth Romero, male    DOB: [birth year], 64 y.o.   MRN: [id number]  HPI He is here for complete examination. He is followed regularly by ophthalmology for his glaucoma. He is also seen GI for the AIN. He would like to be prostate cancer screened. He does complain of intermittent difficulty with constipation. He has recently finished school and has become a little more sedentary. He does complain of decreased hearing especially on the left. He does keep himself fairly physically active. Social and family history as well as health maintenance and immunizations were reviewed. He has no plans to retire. He has no allergy symptoms are GI complaints.   Review of Systems  All other systems reviewed and are negative.      Objective:   Physical Exam BP 116/74 mmHg  Pulse 58  Ht 6\' 1"  (1.854 m)  Wt 178 lb (80.74 kg)  BMI 23.49 kg/m2  SpO2 98%  General Appearance:    Alert, cooperative, no distress, appears stated age  Head:    Normocephalic, without obvious abnormality, atraumatic  Eyes:    PERRL, conjunctiva/corneas clear, EOM's intact, fundi    benign  Ears:    Normal TM's and external ear canals After cerumen was removed from both ears.  Nose:   Nares normal, mucosa normal, no drainage or sinus   tenderness  Throat:   Lips, mucosa, and tongue normal; teeth and gums normal  Neck:   Supple, no lymphadenopathy;  thyroid:  no   enlargement/tenderness/nodules; no carotid   bruit or JVD  Back:    Spine nontender, no curvature, ROM normal, no CVA     tenderness  Lungs:     Clear to auscultation bilaterally without wheezes, rales or     ronchi; respirations unlabored  Chest Wall:    No tenderness or deformity   Heart:    Regular rate and rhythm, S1 and S2 normal, no murmur, rub   or gallop  Breast Exam:    No chest wall tenderness, masses or gynecomastia  Abdomen:     Soft, non-tender, nondistended, normoactive bowel sounds,    no masses, no hepatosplenomegaly       Extremities:   No clubbing, cyanosis or edema  Pulses:   2+ and symmetric all extremities  Skin:   Skin color, texture, turgor normal, no rashes or lesions  Lymph nodes:   Cervical, supraclavicular, and axillary nodes normal  Neurologic:   CNII-XII intact, normal strength, sensation and gait; reflexes 2+ and symmetric throughout          Psych:   Normal mood, affect, hygiene and grooming.          Assessment & Plan:  Routine general medical examination at a health care facility - Plan: PSA, CBC with Differential/Platelet, Comprehensive metabolic panel, Lipid panel  Need for prophylactic vaccination against Streptococcus pneumoniae (pneumococcus) - Plan: Pneumococcal conjugate vaccine 13-valent  Anal intraepithelial neoplasia I (AIN I)  Glaucoma  Prostate cancer screening - Plan: PSA  Constipation, unspecified constipation type  Cerumen impaction, left He will continue to be followed by GI and ophthalmology. Did recommend fluids, bulk in diet and exercise to help with his constipation.

## 2014-12-27 LAB — PSA: PSA: 0.87 ng/mL (ref <=4.00)

## 2015-06-18 ENCOUNTER — Ambulatory Visit (INDEPENDENT_AMBULATORY_CARE_PROVIDER_SITE_OTHER): Payer: BC Managed Care – PPO | Admitting: Family Medicine

## 2015-06-18 ENCOUNTER — Encounter: Payer: Self-pay | Admitting: Family Medicine

## 2015-06-18 VITALS — BP 114/72 | HR 68 | Temp 97.5°F | Ht 73.0 in | Wt 175.6 lb

## 2015-06-18 DIAGNOSIS — K529 Noninfective gastroenteritis and colitis, unspecified: Secondary | ICD-10-CM

## 2015-06-18 NOTE — Progress Notes (Signed)
   Subjective:    Patient ID: Kenneth Romero, male    DOB: 1951-03-24, 64 y.o.   MRN: [id number]  HPI  he has a three-day history of difficulty with diarrhea but in no fever, chills, nausea or vomiting. He did note one episode of seeing red in his stool but  He members eating beats prior to that.  he states he is no longer having diarrhea.  Review of Systems     Objective:   Physical Exam Alert and in no distress. Tympanic membranes and canals are normal. Pharyngeal area is normal. Neck is supple without adenopathy or thyromegaly. Cardiac exam shows a regular sinus rhythm without murmurs or gallops. Lungs are clear to auscultation. Donald exam shows active bowel sounds without masses or tenderness        Assessment & Plan:  Gastroenteritis  Ackerman supportive care. Informed him he can eat anything he felt comfortable eating. He will call if further trouble.

## 2016-04-18 ENCOUNTER — Encounter: Payer: Self-pay | Admitting: Family Medicine

## 2016-04-18 ENCOUNTER — Ambulatory Visit (INDEPENDENT_AMBULATORY_CARE_PROVIDER_SITE_OTHER): Payer: BC Managed Care – PPO | Admitting: Family Medicine

## 2016-04-18 VITALS — BP 112/78 | HR 66 | Ht 71.0 in | Wt 176.0 lb

## 2016-04-18 DIAGNOSIS — K6282 Dysplasia of anus: Secondary | ICD-10-CM | POA: Diagnosis not present

## 2016-04-18 DIAGNOSIS — Z23 Encounter for immunization: Secondary | ICD-10-CM | POA: Diagnosis not present

## 2016-04-18 DIAGNOSIS — H409 Unspecified glaucoma: Secondary | ICD-10-CM

## 2016-04-18 DIAGNOSIS — Z1159 Encounter for screening for other viral diseases: Secondary | ICD-10-CM | POA: Diagnosis not present

## 2016-04-18 DIAGNOSIS — Z0000 Encounter for general adult medical examination without abnormal findings: Secondary | ICD-10-CM

## 2016-04-18 LAB — POCT URINALYSIS DIPSTICK
Bilirubin, UA: NEGATIVE
Blood, UA: NEGATIVE
GLUCOSE UA: NEGATIVE
KETONES UA: NEGATIVE
LEUKOCYTES UA: NEGATIVE
Nitrite, UA: NEGATIVE
PROTEIN UA: NEGATIVE
SPEC GRAV UA: 1.02
Urobilinogen, UA: NEGATIVE
pH, UA: 6.5

## 2016-04-18 NOTE — Progress Notes (Signed)
Subjective:   HPI  Kenneth Romero is a 65 y.o. male who presents for a complete physical.  Medical care team includes:  Dr.Roy Whitaker   Preventative care: Last ophthalmology visit:04/07/16 Last dental visit: 8/17 Last colonoscopy:12/17/10 Last prostate exam: ? Last EKG: ? Last labs: 12/26/14  Prior vaccinations: TD or Tdap:11/04/13 Influenza:04/18/16 Pneumococcal:23:07/08/99 13: 12/26/14 Shingles/Zostavax:2/7/1 He is here for complete examination. He has enjoyed excellent health. He is taking only over-the-counter medications plus and ophthalmic solution for glaucoma. He continues to work and very much enjoys this. He walks back and forth to school which is at least a 20 minute walk. He does get follow-up with Dr. Benson Norway concerning his dysplasia. His last report showed no evidence of dysplasia.  Reviewed their medical, surgical, family, social, medication, and allergy history and updated chart as appropriate.    Review of Systems Constitutional: -fever, -chills, -sweats, -unexpected weight change, -decreased appetite, -fatigue Allergy: -sneezing, -itching, -congestion Dermatology: -changing moles, --rash, -lumps ENT: -runny nose, -ear pain, -sore throat, -hoarseness, -sinus pain, -teeth pain, - ringing in ears, -hearing loss, -nosebleeds Cardiology: -chest pain, -palpitations, -swelling, -difficulty breathing when lying flat, -waking up short of breath Respiratory: -cough, -shortness of breath, -difficulty breathing with exercise or exertion, -wheezing, -coughing up blood Gastroenterology: -abdominal pain, -nausea, -vomiting, -diarrhea, -constipation, -blood in stool, -changes in bowel movement, -difficulty swallowing or eating Hematology: -bleeding, -bruising  Musculoskeletal: -joint aches, -muscle aches, -joint swelling, -back pain, -neck pain, -cramping, -changes in gait Ophthalmology: denies vision changes, eye redness, itching, discharge Urology: -burning with urination,  -difficulty urinating, -blood in urine, -urinary frequency, -urgency, -incontinence Neurology: -headache, -weakness, -tingling, -numbness, -memory loss, -falls, -dizziness Psychology: -depressed mood, -agitation, -sleep problems     Objective:   Physical Exam   General appearance: alert, no distress, WD/WN,  Skin:Normal  HEENT: normocephalic, conjunctiva/corneas normal, sclerae anicteric, PERRLA, EOMi, nares patent, no discharge or erythema, pharynx normal Oral cavity: MMM, tongue normal, teeth normal Neck: supple, no lymphadenopathy, no thyromegaly, no masses, normal ROM Chest: non tender, normal shape and expansion Heart: RRR, normal S1, S2, no murmurs Lungs: CTA bilaterally, no wheezes, rhonchi, or rales Abdomen: +bs, soft, non tender, non distended, no masses, no hepatomegaly, no splenomegaly, no bruits  Musculoskeletal: upper extremities non tender, no obvious deformity, normal ROM throughout, lower extremities non tender, no obvious deformity, normal ROM throughout Extremities: no edema, no cyanosis, no clubbing Pulses: 2+ symmetric, upper and lower extremities, normal cap refill Neurological: alert, oriented x 3, CN2-12 intact, strength normal upper extremities and lower extremities, sensation normal throughout, DTRs 2+ throughout, no cerebellar signs, gait normal Psychiatric: normal affect, behavior normal, pleasant    Assessment and Plan :   Routine general medical examination at a health care facility - Plan: POCT Urinalysis Dipstick  Need for prophylactic vaccination and inoculation against influenza - Plan: Flu vaccine HIGH DOSE PF (Fluzone High dose)  Need for hepatitis C screening test - Plan: Hepatitis C antibody  Glaucoma, unspecified glaucoma type, unspecified laterality  Anal dysplasia  Glaucoma of both eyes, unspecified glaucoma type Overall he is doing quite nicely. He will continue to get follow-up with GI and ophthalmology. Otherwise he is to continue to  take excellent care of himself.    Physical exam - discussed healthy lifestyle, diet, exercise, preventative care, vaccinations, and addressed their concerns.

## 2016-04-19 LAB — HEPATITIS C ANTIBODY: HCV AB: NEGATIVE

## 2017-08-06 ENCOUNTER — Encounter: Payer: Self-pay | Admitting: Family Medicine

## 2017-08-06 ENCOUNTER — Ambulatory Visit: Payer: BC Managed Care – PPO | Admitting: Family Medicine

## 2017-08-06 VITALS — BP 120/82 | HR 61 | Ht 70.5 in | Wt 176.0 lb

## 2017-08-06 DIAGNOSIS — Z136 Encounter for screening for cardiovascular disorders: Secondary | ICD-10-CM

## 2017-08-06 DIAGNOSIS — Z23 Encounter for immunization: Secondary | ICD-10-CM

## 2017-08-06 DIAGNOSIS — K6282 Dysplasia of anus: Secondary | ICD-10-CM

## 2017-08-06 DIAGNOSIS — H409 Unspecified glaucoma: Secondary | ICD-10-CM

## 2017-08-06 DIAGNOSIS — Z0000 Encounter for general adult medical examination without abnormal findings: Secondary | ICD-10-CM

## 2017-08-06 NOTE — Progress Notes (Signed)
   Subjective:    Patient ID: JONTRELL BUSHONG, male    DOB: 06/09/1951, 67 y.o.   MRN: [id number]  HPI He is here for complete examination.  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  He does have glaucoma and sees his ophthalmologist regularly for that.  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  He continues on OTC medications.  He has no other concerns or complaints.  Family and social history as well as health maintenance and immunizations were reviewed   Review of Systems  All other systems reviewed and are negative.      Objective:   Physical Exam BP 120/82   Pulse 61   Ht 5' 10.5" (1.791 m)   Wt 176 lb (79.8 kg)   BMI 24.90 kg/m   General Appearance:    Alert, cooperative, no distress, appears stated age  Head:    Normocephalic, without obvious abnormality, atraumatic  Eyes:    PERRL, conjunctiva/corneas clear, EOM's intact, fundi    benign  Ears:    Normal TM's and external ear canals  Nose:   Nares normal, mucosa normal, no drainage or sinus   tenderness  Throat:   Lips, mucosa, and tongue normal; teeth and gums normal  Neck:   Supple, no lymphadenopathy;  thyroid:  no   enlargement/tenderness/nodules; no carotid   bruit or JVD     Lungs:     Clear to auscultation bilaterally without wheezes, rales or     ronchi; respirations unlabored      Heart:    Regular rate and rhythm, S1 and S2 normal, no murmur, rub   or gallop     Abdomen:     Soft, non-tender, nondistended, normoactive bowel sounds,    no masses, no hepatosplenomegaly  Genitalia:   Deferred  Rectal:   Deferred  Extremities:   No clubbing, cyanosis or edema  Pulses:   2+ and symmetric all extremities  Skin:   Skin color, texture, turgor normal, no rashes or lesions  Lymph nodes:   Cervical, supraclavicular, and axillary nodes normal  Neurologic:   CNII-XII intact, normal strength, sensation and gait; reflexes 2+ and symmetric throughout          Psych:   Normal mood, affect,  hygiene and grooming.           Assessment & Plan:  Routine general medical examination at a health care facility - Plan: CBC with Differential/Platelet, Comprehensive metabolic panel, Lipid panel  Needs flu shot - Plan: Flu vaccine HIGH DOSE PF (Fluzone High Dose)  Anal intraepithelial neoplasia I (AIN I)  Glaucoma, unspecified glaucoma type, unspecified laterality  Need for shingles vaccine - Plan: Varicella-zoster vaccine IM (Shingrix)  Screening for AAA (abdominal aortic aneurysm) - Plan: US Aorta Encouraged him to continue to take good care of himself.

## 2017-08-07 LAB — CBC WITH DIFFERENTIAL/PLATELET
BASOS ABS: 0 10*3/uL (ref 0.0–0.2)
Basos: 0 %
EOS (ABSOLUTE): 0.1 10*3/uL (ref 0.0–0.4)
Eos: 2 %
Hematocrit: 44.6 % (ref 37.5–51.0)
Hemoglobin: 15.1 g/dL (ref 13.0–17.7)
Immature Grans (Abs): 0 10*3/uL (ref 0.0–0.1)
Immature Granulocytes: 0 %
LYMPHS ABS: 1.4 10*3/uL (ref 0.7–3.1)
Lymphs: 27 %
MCH: 31.7 pg (ref 26.6–33.0)
MCHC: 33.9 g/dL (ref 31.5–35.7)
MCV: 94 fL (ref 79–97)
Monocytes Absolute: 0.6 10*3/uL (ref 0.1–0.9)
Monocytes: 11 %
NEUTROS ABS: 3.2 10*3/uL (ref 1.4–7.0)
Neutrophils: 60 %
PLATELETS: 249 10*3/uL (ref 150–379)
RBC: 4.76 x10E6/uL (ref 4.14–5.80)
RDW: 12.9 % (ref 12.3–15.4)
WBC: 5.3 10*3/uL (ref 3.4–10.8)

## 2017-08-07 LAB — COMPREHENSIVE METABOLIC PANEL
A/G RATIO: 2.3 — AB (ref 1.2–2.2)
ALBUMIN: 4.3 g/dL (ref 3.6–4.8)
ALK PHOS: 65 IU/L (ref 39–117)
ALT: 20 IU/L (ref 0–44)
AST: 20 IU/L (ref 0–40)
BILIRUBIN TOTAL: 0.9 mg/dL (ref 0.0–1.2)
BUN / CREAT RATIO: 13 (ref 10–24)
BUN: 9 mg/dL (ref 8–27)
CHLORIDE: 99 mmol/L (ref 96–106)
CO2: 25 mmol/L (ref 20–29)
Calcium: 9.5 mg/dL (ref 8.6–10.2)
Creatinine, Ser: 0.69 mg/dL — ABNORMAL LOW (ref 0.76–1.27)
GFR calc Af Amer: 113 mL/min/{1.73_m2} (ref >59)
GFR calc non Af Amer: 98 mL/min/{1.73_m2} (ref >59)
GLUCOSE: 95 mg/dL (ref 65–99)
Globulin, Total: 1.9 g/dL (ref 1.5–4.5)
POTASSIUM: 4.7 mmol/L (ref 3.5–5.2)
Sodium: 137 mmol/L (ref 134–144)
Total Protein: 6.2 g/dL (ref 6.0–8.5)

## 2017-08-07 LAB — LIPID PANEL
CHOLESTEROL TOTAL: 164 mg/dL (ref 100–199)
Chol/HDL Ratio: 2.6 ratio (ref 0.0–5.0)
HDL: 62 mg/dL (ref >39)
LDL Calculated: 84 mg/dL (ref 0–99)
Triglycerides: 88 mg/dL (ref 0–149)
VLDL CHOLESTEROL CAL: 18 mg/dL (ref 5–40)

## 2017-08-12 ENCOUNTER — Other Ambulatory Visit: Payer: Self-pay | Admitting: Family Medicine

## 2017-08-12 ENCOUNTER — Ambulatory Visit
Admission: RE | Admit: 2017-08-12 | Discharge: 2017-08-12 | Disposition: A | Discharge status: Home / Self Care | Payer: BC Managed Care – PPO | Source: Ambulatory Visit | Attending: Family Medicine | Admitting: Family Medicine

## 2017-08-12 DIAGNOSIS — Z136 Encounter for screening for cardiovascular disorders: Secondary | ICD-10-CM

## 2017-11-03 ENCOUNTER — Other Ambulatory Visit (INDEPENDENT_AMBULATORY_CARE_PROVIDER_SITE_OTHER): Payer: BC Managed Care – PPO

## 2017-11-03 DIAGNOSIS — Z23 Encounter for immunization: Secondary | ICD-10-CM | POA: Diagnosis not present

## 2018-09-13 ENCOUNTER — Ambulatory Visit (INDEPENDENT_AMBULATORY_CARE_PROVIDER_SITE_OTHER): Payer: BC Managed Care – PPO | Admitting: Family Medicine

## 2018-09-13 ENCOUNTER — Encounter: Payer: Self-pay | Admitting: Family Medicine

## 2018-09-13 VITALS — BP 112/76 | HR 61 | Temp 97.7°F | Ht 69.5 in | Wt 169.8 lb

## 2018-09-13 DIAGNOSIS — H409 Unspecified glaucoma: Secondary | ICD-10-CM | POA: Diagnosis not present

## 2018-09-13 DIAGNOSIS — K6282 Dysplasia of anus: Secondary | ICD-10-CM

## 2018-09-13 DIAGNOSIS — Z0000 Encounter for general adult medical examination without abnormal findings: Secondary | ICD-10-CM | POA: Diagnosis not present

## 2018-09-13 LAB — POCT URINALYSIS DIP (PROADVANTAGE DEVICE)
Bilirubin, UA: NEGATIVE
Blood, UA: NEGATIVE
GLUCOSE UA: NEGATIVE mg/dL
Ketones, POC UA: NEGATIVE mg/dL
LEUKOCYTES UA: NEGATIVE
NITRITE UA: NEGATIVE
Protein Ur, POC: NEGATIVE mg/dL
SPECIFIC GRAVITY, URINE: 1.015
Urobilinogen, Ur: 3.5
pH, UA: 7 (ref 5.0–8.0)

## 2018-09-13 NOTE — Progress Notes (Signed)
   Subjective:    Patient ID: Kenneth Romero, male    DOB: [birth year], 68 y.o.   MRN: [id number]  HPI He is here for complete examination.  He does now note some slight intermittent knee pain especially if he sits for long periods of time.  No popping locking or grinding.  He also has nocturia x2 but no hesitancy, decreased stream or feeling of incomplete emptying.  He also has a history of AIN plans to follow-up with Dr. Margretta Sidle concerning that.  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  He does have underlying glaucoma and does follow-up with his ophthalmologist.  He does take OTC medications.  Family and social history as well as health maintenance and immunizations was reviewed   Review of Systems  All other systems reviewed and are negative.      Objective:   Physical Exam BP 112/76 (BP Location: Left Arm, Patient Position: Sitting)   Pulse 61   Temp 97.7 F (36.5 C)   Ht 5' 9.5" (1.765 m)   Wt 169 lb 12.8 oz (77 kg)   SpO2 98%   BMI 24.72 kg/m   General Appearance:    Alert, cooperative, no distress, appears stated age  Head:    Normocephalic, without obvious abnormality, atraumatic  Eyes:    PERRL, conjunctiva/corneas clear, EOM's intact, fundi    benign  Ears:    Normal TM's and external ear canals  Nose:   Nares normal, mucosa normal, no drainage or sinus   tenderness  Throat:   Lips, mucosa, and tongue normal; teeth and gums normal  Neck:   Supple, no lymphadenopathy;  thyroid:  no   enlargement/tenderness/nodules; no carotid   bruit or JVD     Lungs:     Clear to auscultation bilaterally without wheezes, rales or     ronchi; respirations unlabored      Heart:    Regular rate and rhythm, S1 and S2 normal, no murmur, rub   or gallop     Abdomen:     Soft, non-tender, nondistended, normoactive bowel sounds,    no masses, no hepatosplenomegaly  Genitalia:   deferred  Rectal:   Deferred  Extremities:   No clubbing, cyanosis or edema    Pulses:   2+ and symmetric all extremities  Skin:   Skin color, texture, turgor normal, no rashes or lesions  Lymph nodes:   Cervical, supraclavicular, and axillary nodes normal  Neurologic:   CNII-XII intact, normal strength, sensation and gait; reflexes 2+ and symmetric throughout          Psych:   Normal mood, affect, hygiene and grooming.           Assessment & Plan:  Routine general medical examination at a health care facility - Plan: POCT Urinalysis DIP (Proadvantage Device)  Anal intraepithelial neoplasia I (AIN I)  Glaucoma  Follow-up with ophthalmology as well as with Dr. Geoffry Paradise concerning his AIN.  Encouraged him to continue to take good care of himself.  He and I both agree that no blood work needs to be done since there are no abnormalities.

## 2018-10-18 ENCOUNTER — Telehealth: Payer: Self-pay | Admitting: Family Medicine

## 2018-10-18 NOTE — Telephone Encounter (Signed)
Pt called, states he fell a few days ago and injured his jaw He states that it is out of position and he is having trouble chewing Spoke to Dr. Redmond School and he states pt will need to see oral surgeon Pt will contact oral surgeon and let us know if he needs any assistance getting this scheduled

## 2018-11-18 LAB — HM COLONOSCOPY

## 2019-09-01 ENCOUNTER — Ambulatory Visit: Payer: BC Managed Care – PPO | Attending: Internal Medicine

## 2019-09-01 DIAGNOSIS — Z20822 Contact with and (suspected) exposure to covid-19: Secondary | ICD-10-CM

## 2019-09-02 LAB — NOVEL CORONAVIRUS, NAA: SARS-CoV-2, NAA: NOT DETECTED

## 2019-09-14 ENCOUNTER — Other Ambulatory Visit: Payer: Self-pay

## 2019-09-14 ENCOUNTER — Encounter: Payer: Self-pay | Admitting: Family Medicine

## 2019-09-14 ENCOUNTER — Ambulatory Visit: Payer: BC Managed Care – PPO | Admitting: Family Medicine

## 2019-09-14 VITALS — BP 132/86 | HR 68 | Temp 97.1°F | Ht 69.5 in | Wt 166.0 lb

## 2019-09-14 DIAGNOSIS — K6282 Dysplasia of anus: Secondary | ICD-10-CM | POA: Diagnosis not present

## 2019-09-14 DIAGNOSIS — H409 Unspecified glaucoma: Secondary | ICD-10-CM | POA: Diagnosis not present

## 2019-09-14 DIAGNOSIS — Z0000 Encounter for general adult medical examination without abnormal findings: Secondary | ICD-10-CM

## 2019-09-14 LAB — POCT URINALYSIS DIP (PROADVANTAGE DEVICE)
Bilirubin, UA: NEGATIVE
Blood, UA: NEGATIVE
Glucose, UA: NEGATIVE mg/dL
Ketones, POC UA: NEGATIVE mg/dL
Leukocytes, UA: NEGATIVE
Nitrite, UA: NEGATIVE
Protein Ur, POC: NEGATIVE mg/dL
Specific Gravity, Urine: 1.015
Urobilinogen, Ur: 0.2
pH, UA: 7.5 (ref 5.0–8.0)

## 2019-09-14 NOTE — Progress Notes (Signed)
   Subjective:    Patient ID: SOLOMAN DUBE, male    DOB: 20-Nov-1950, 69 y.o.   MRN: [id number]  HPI He is here for complete examination.  He has no concerns or complaints.  He does have glaucoma and does see his ophthalmologist regularly.  He did have a colonoscopy last year and no follow-up for 10 years was scheduled.  He is taking multiple over-the-counter medications.  Exercise has been minimal due to Covid.  He did get both Covid vaccinations.  Family and social history as well as health maintenance and immunizations was reviewed.  He does continue to work at [employer] other systems reviewed and are negative.      Objective:   Physical Exam Alert and in no distress. Tympanic membranes and canals are normal. Pharyngeal area is normal. Neck is supple without adenopathy or thyromegaly. Cardiac exam shows a regular sinus rhythm without murmurs or gallops. Lungs are clear to auscultation.  Abdominal exam shows no masses or tenderness with normal bowel sounds       Assessment & Plan:  Routine general medical examination at a health care facility - Plan: POCT Urinalysis DIP (Proadvantage Device), Lipid Panel, CBC with Differential, Comprehensive metabolic panel  Glaucoma, unspecified glaucoma type, unspecified laterality  Anal intraepithelial neoplasia I (AIN I) Encouraged him to continue to take good care of himself and start in a regular exercise program.

## 2019-09-15 LAB — LIPID PANEL
Chol/HDL Ratio: 2.6 ratio (ref 0.0–5.0)
Cholesterol, Total: 179 mg/dL (ref 100–199)
HDL: 68 mg/dL (ref >39)
LDL Chol Calc (NIH): 97 mg/dL (ref 0–99)
Triglycerides: 76 mg/dL (ref 0–149)
VLDL Cholesterol Cal: 14 mg/dL (ref 5–40)

## 2019-09-15 LAB — CBC WITH DIFFERENTIAL/PLATELET
Basophils Absolute: 0 10*3/uL (ref 0.0–0.2)
Basos: 0 %
EOS (ABSOLUTE): 0.1 10*3/uL (ref 0.0–0.4)
Eos: 2 %
Hematocrit: 44.2 % (ref 37.5–51.0)
Hemoglobin: 15.3 g/dL (ref 13.0–17.7)
Immature Grans (Abs): 0 10*3/uL (ref 0.0–0.1)
Immature Granulocytes: 0 %
Lymphocytes Absolute: 1.4 10*3/uL (ref 0.7–3.1)
Lymphs: 30 %
MCH: 32.2 pg (ref 26.6–33.0)
MCHC: 34.6 g/dL (ref 31.5–35.7)
MCV: 93 fL (ref 79–97)
Monocytes Absolute: 0.6 10*3/uL (ref 0.1–0.9)
Monocytes: 13 %
Neutrophils Absolute: 2.7 10*3/uL (ref 1.4–7.0)
Neutrophils: 55 %
Platelets: 211 10*3/uL (ref 150–450)
RBC: 4.75 x10E6/uL (ref 4.14–5.80)
RDW: 11.9 % (ref 11.6–15.4)
WBC: 4.9 10*3/uL (ref 3.4–10.8)

## 2019-09-15 LAB — COMPREHENSIVE METABOLIC PANEL
ALT: 17 IU/L (ref 0–44)
AST: 22 IU/L (ref 0–40)
Albumin/Globulin Ratio: 3.1 — ABNORMAL HIGH (ref 1.2–2.2)
Albumin: 4.7 g/dL (ref 3.8–4.8)
Alkaline Phosphatase: 71 IU/L (ref 39–117)
BUN/Creatinine Ratio: 15 (ref 10–24)
BUN: 9 mg/dL (ref 8–27)
Bilirubin Total: 0.9 mg/dL (ref 0.0–1.2)
CO2: 24 mmol/L (ref 20–29)
Calcium: 9.3 mg/dL (ref 8.6–10.2)
Chloride: 97 mmol/L (ref 96–106)
Creatinine, Ser: 0.61 mg/dL — ABNORMAL LOW (ref 0.76–1.27)
GFR calc Af Amer: 117 mL/min/{1.73_m2} (ref >59)
GFR calc non Af Amer: 101 mL/min/{1.73_m2} (ref >59)
Globulin, Total: 1.5 g/dL (ref 1.5–4.5)
Glucose: 95 mg/dL (ref 65–99)
Potassium: 4.5 mmol/L (ref 3.5–5.2)
Sodium: 134 mmol/L (ref 134–144)
Total Protein: 6.2 g/dL (ref 6.0–8.5)

## 2020-06-04 ENCOUNTER — Ambulatory Visit
Admission: RE | Admit: 2020-06-04 | Discharge: 2020-06-04 | Disposition: A | Discharge status: Home / Self Care | Payer: BC Managed Care – PPO | Source: Ambulatory Visit | Attending: Family Medicine | Admitting: Family Medicine

## 2020-06-04 ENCOUNTER — Encounter: Payer: Self-pay | Admitting: Family Medicine

## 2020-06-04 ENCOUNTER — Other Ambulatory Visit: Payer: Self-pay

## 2020-06-04 ENCOUNTER — Ambulatory Visit: Payer: BC Managed Care – PPO | Admitting: Family Medicine

## 2020-06-04 VITALS — BP 126/76 | HR 60 | Temp 96.9°F | Wt 178.8 lb

## 2020-06-04 DIAGNOSIS — Z23 Encounter for immunization: Secondary | ICD-10-CM

## 2020-06-04 DIAGNOSIS — M25572 Pain in left ankle and joints of left foot: Secondary | ICD-10-CM | POA: Diagnosis not present

## 2020-06-04 NOTE — Progress Notes (Signed)
   Subjective:    Patient ID: Kenneth Romero, male    DOB: 06-Feb-1951, 69 y.o.   MRN: [id number]  HPI He slipped off of a curb walking last Wednesday and sustained left lateral ankle discomfort. He was able to walk on it but it is quite painful. He has not had a flu shot this year yet.  Review of Systems     Objective:   Physical Exam Exam of the left ankle shows good motion of the foot and ankle with no tenderness over ATF but some slight tenderness proximal to the head of the  fibula. No laxity noted.       Assessment & Plan:  Acute left ankle pain - Plan: DG Ankle Complete Left  Need for influenza vaccination - Plan: Flu vaccine HIGH DOSE PF (Fluzone High dose)

## 2020-07-12 ENCOUNTER — Encounter: Payer: Self-pay | Admitting: Family Medicine

## 2020-07-12 ENCOUNTER — Telehealth (INDEPENDENT_AMBULATORY_CARE_PROVIDER_SITE_OTHER): Payer: BC Managed Care – PPO | Admitting: Family Medicine

## 2020-07-12 VITALS — Temp 97.0°F | Wt 178.0 lb

## 2020-07-12 DIAGNOSIS — F418 Other specified anxiety disorders: Secondary | ICD-10-CM

## 2020-07-12 NOTE — Progress Notes (Signed)
   Subjective:    Patient ID: Kenneth Romero, male    DOB: 10/26/1950, 70 y.o.   MRN: [id number]  HPI I connected with  Kenneth Romero on 07/12/20 by a video enabled telemedicine application and verified that I am speaking with the correct person using two identifiers.  Caregility used.  I am in the office.  He is at home. I discussed the limitations of evaluation and management by telemedicine. The patient expressed understanding and agreed to proceed. He has concerns over Covid.  He is quite concerned over the rapid increase in Covid and has some anxious and had some difficulty with sleep because of this.  He has had his booster shot.  Presently he has no symptoms at all.  He does work at [employer] and is concerned about getting exposed.   Review of Systems     Objective:   Physical Exam Alert and in no distress otherwise not examined       Assessment & Plan:  Anxiety about health I discussed in detail Covid, possible symptoms, infection and the best way to prevent this.  Explained that since he has had the booster and is wearing his mask and tends to stay at least 6 feet away from her body he is doing as much is can be done.  Discussed what needs to be done if he indeed does have symptoms and how to handle this.  He seemed comfortable with that.  21 minutes spent discussing these issues with him

## 2020-07-19 ENCOUNTER — Other Ambulatory Visit: Payer: Self-pay

## 2020-07-19 ENCOUNTER — Telehealth: Payer: BC Managed Care – PPO | Admitting: Family Medicine

## 2020-07-19 ENCOUNTER — Encounter: Payer: Self-pay | Admitting: Family Medicine

## 2020-07-19 VITALS — Temp 97.0°F | Wt 179.0 lb

## 2020-07-19 DIAGNOSIS — F418 Other specified anxiety disorders: Secondary | ICD-10-CM

## 2020-07-19 DIAGNOSIS — R5383 Other fatigue: Secondary | ICD-10-CM | POA: Diagnosis not present

## 2020-07-19 NOTE — Progress Notes (Signed)
   Subjective:    Patient ID: Kenneth Romero, male    DOB: [birth year], 70 y.o.   MRN: [id number]  HPI I connected with  [name] on 07/19/20 by a video enabled telemedicine application and verified that I am speaking with the correct person using two identifiers.  Caregility used.  I am at home patient is at home. I discussed the limitations of evaluation and management by telemedicine. The patient expressed understanding and agreed to proceed. He is having difficulty with fatigue and feels as if he might have COVID.  He is very concerned about getting out in the public.  He does have an appointment to get a COVID test at the [facility].  I went ahead and encouraged him to go ahead and get the test and let me know what the result is.  He has been fully vaccinated.   Review of Systems     Objective:   Physical Exam Alert and in no distress otherwise not examined       Assessment & Plan:  Fatigue, unspecified type  Anxiety about health He will get the COVID test and inform me about the results when he gets it.  Explained that it might take 48 to 72 hours to get the results. 11 minutes spent in discussing this issue with him.

## 2020-07-30 ENCOUNTER — Encounter: Payer: Self-pay | Admitting: Family Medicine

## 2020-08-15 ENCOUNTER — Encounter: Payer: Self-pay | Admitting: Medical

## 2020-08-15 ENCOUNTER — Telehealth: Payer: BC Managed Care – PPO | Admitting: Medical

## 2020-08-15 ENCOUNTER — Other Ambulatory Visit: Payer: Self-pay

## 2020-08-15 VITALS — BP 132/80 | HR 84 | Temp 99.7°F | Ht 72.0 in | Wt 170.0 lb

## 2020-08-15 DIAGNOSIS — R300 Dysuria: Secondary | ICD-10-CM | POA: Diagnosis not present

## 2020-08-15 DIAGNOSIS — R509 Fever, unspecified: Secondary | ICD-10-CM | POA: Insufficient documentation

## 2020-08-15 LAB — POCT URINALYSIS DIP (PROADVANTAGE DEVICE)
Bilirubin, UA: NEGATIVE
Glucose, UA: NEGATIVE mg/dL
Nitrite, UA: POSITIVE — AB
Protein Ur, POC: 30 mg/dL — AB
Specific Gravity, Urine: 1.015
Urobilinogen, Ur: 0.2
pH, UA: 6 (ref 5.0–8.0)

## 2020-08-15 MED ORDER — NITROFURANTOIN MONOHYD MACRO 100 MG PO CAPS: 100.0000 mg | ORAL_CAPSULE | Freq: Two times a day (BID) | ORAL | 0 refills | Status: DC

## 2020-08-15 NOTE — Progress Notes (Signed)
  Subjective:     Patient ID: Kenneth Romero, male   DOB: 08-02-1950, 70 y.o.   MRN: [id number]  HPI Chief Complaint  Patient presents with  . Urinary Tract Infection    Burning with urination, fever and fatigue. Symptoms started yesterday    He reports 1 day of fever, burning with urination, both unusual for him, feels some fatigue.   Has some urgency.  No urinary frequent.   No cold symptoms, no cough, no runny nose.   No nausea, vomiting, no diarrhea, no abdominal pain.  No testicle pain or swelling.   No penile discharge.  No back pain.  No blood in urine or stool.  No rash.  Water intake is not bad.    Had to drive up to [facility] over the weekend.  Hydrate was about same as usual.  No heavy caffeine use.  No hx/o urinary tract or prostate infection.    Last illness  - 2 weeks in january a little run down.  Did a home covid test this morning due to fever, negative result.  No other aggravating or relieving factors. No other complaint.   Past Medical History:  Diagnosis Date  . Anal intraepithelial neoplasia I (AIN I)   . Vitiligo    Past Surgical History:  Procedure Laterality Date  . COLONOSCOPY  2006   DR.HUNG  . SIGMOIDOSCOPY  2010   DR.HUNG    Review of Systems As in subjective    Objective:   Physical Exam BP 132/80   Pulse 84   Temp 99.7 F (37.6 C)   Ht 6' (1.829 m)   Wt 170 lb (77.1 kg)   SpO2 96%   BMI 23.06 kg/m   Gen: wd, wn, nad Abdomen nontender, no hepatosplenomegaly, positive bowel sounds, soft, no rebound Back-no particular CVA tenderness       Assessment:     Encounter Diagnoses  Name Primary?  . Dysuria Yes  . Fever, unspecified fever cause        Plan:     Urinalysis abnormal suggestive of urinary tract infection.  Discussed differential which could include prostatitis, urinary tract infection or other.  Begin Macrobid, urine culture sent.  Continue to hydrate well.  Advised if much worse in the coming days such as fever,  uncontrollable nausea vomiting, much sicker feeling then go to the emergency department or call back and recheck.  [name] was seen today for urinary tract infection.  Diagnoses and all orders for this visit:  Dysuria -     Urine Culture -     POCT Urinalysis DIP (Proadvantage Device)  Fever, unspecified fever cause -     Urine Culture  Other orders -     nitrofurantoin, macrocrystal-monohydrate, (MACROBID) 100 MG capsule; Take 1 capsule (100 mg total) by mouth 2 (two) times daily.  f/u pending labs

## 2020-08-17 LAB — URINE CULTURE

## 2020-08-27 ENCOUNTER — Other Ambulatory Visit: Payer: Self-pay

## 2020-08-27 ENCOUNTER — Telehealth: Payer: BC Managed Care – PPO | Admitting: Medical

## 2020-08-27 DIAGNOSIS — N3000 Acute cystitis without hematuria: Secondary | ICD-10-CM | POA: Diagnosis not present

## 2020-08-27 DIAGNOSIS — R309 Painful micturition, unspecified: Secondary | ICD-10-CM

## 2020-08-27 LAB — POCT URINALYSIS DIP (PROADVANTAGE DEVICE)
Bilirubin, UA: NEGATIVE
Glucose, UA: NEGATIVE mg/dL
Ketones, POC UA: NEGATIVE mg/dL
Nitrite, UA: POSITIVE — AB
Protein Ur, POC: 100 mg/dL — AB
Specific Gravity, Urine: 1.015
Urobilinogen, Ur: 0.2
pH, UA: 7 (ref 5.0–8.0)

## 2020-08-27 MED ORDER — CIPROFLOXACIN HCL 500 MG PO TABS: 500.0000 mg | ORAL_TABLET | Freq: Two times a day (BID) | ORAL | 0 refills | Status: DC

## 2020-08-27 NOTE — Progress Notes (Signed)
Done

## 2020-08-27 NOTE — Progress Notes (Signed)
  Subjective:     Patient ID: Kenneth Romero, male   DOB: 07-26-1950, 70 y.o.   MRN: [id number]   This visit type was conducted due to national recommendations for restrictions regarding the COVID-19 Pandemic (e.g. social distancing) in an effort to limit this patient's exposure and mitigate transmission in our community.  Due to their co-morbid illnesses, this patient is at least at moderate risk for complications without adequate follow up.  This format is felt to be most appropriate for this patient at this time.    Documentation for virtual audio and video telecommunications through [place] encounter:  The patient was located at home. The provider was located in the office. The patient did consent to this visit and is aware of possible charges through their insurance for this visit.  The other persons participating in this telemedicine service were none. Time spent on call was 11 minutes and in review of previous records >15 minutes total.  This virtual service is not related to other E/M service within previous 7 days.   HPI Chief Complaint  Patient presents with  . Urinary Tract Infection    Painful urination     Virtual consult for ongoing urinary issue.  We recently did a consult for urinary tract infection.  He finished the Macrobid antibiotic.  He had 3 days of basically no symptoms and then the symptoms came back immediately.. Urine is cloudy, has urinary frequency and urgency, does not feel well but no fever, no nausea or vomiting, no belly or back pain.  No blood in the urine.  No history of prostatitis or urinary tract infection prior.  No history of kidney stone.  No other aggravating or relieving factors. No other complaint.   Past Medical History:  Diagnosis Date  . Anal intraepithelial neoplasia I (AIN I)   . Vitiligo    Past Surgical History:  Procedure Laterality Date  . COLONOSCOPY  2006   DR.HUNG  . SIGMOIDOSCOPY  2010   DR.HUNG    Review of Systems As  in subjective    Objective:   Physical Exam  Gen: wd, wn, nad Not examined in person as this was a virtual consult       Assessment:     Encounter Diagnoses  Name Primary?  . Painful urination Yes  . Acute cystitis without hematuria        Plan:     I reviewed his recent urine culture results from last visit which were positive for E. coli and antibiotics were sensitive.  His urinalysis today is still abnormal.  He came in and did a clean-catch urine sample today  He finished Macrobid already.  He had 3 days of being asymptomatic and the symptoms then recurred.  Begin Cipro.  Discussed risk and benefits of medication.  We will go ahead and refer to urology for further evaluation   Kenneth Romero was seen today for urinary tract infection.  Diagnoses and all orders for this visit:  Painful urination -     POCT Urinalysis DIP (Proadvantage Device)  Acute cystitis without hematuria  Other orders -     ciprofloxacin (CIPRO) 500 MG tablet; Take 1 tablet (500 mg total) by mouth 2 (two) times daily.  f/u pending urology

## 2020-08-27 NOTE — Addendum Note (Signed)
Addended by: Edgar Frisk on: 08/27/2020 04:00 PM   Modules accepted: Orders

## 2020-09-17 ENCOUNTER — Encounter: Payer: BC Managed Care – PPO | Admitting: Family Medicine

## 2020-09-21 ENCOUNTER — Encounter: Payer: Self-pay | Admitting: Family Medicine

## 2020-09-21 ENCOUNTER — Ambulatory Visit: Payer: BC Managed Care – PPO | Admitting: Family Medicine

## 2020-09-21 VITALS — BP 126/86 | HR 70 | Temp 96.0°F | Wt 171.4 lb

## 2020-09-21 DIAGNOSIS — N3000 Acute cystitis without hematuria: Secondary | ICD-10-CM

## 2020-09-21 LAB — POCT URINALYSIS DIP (PROADVANTAGE DEVICE)
Bilirubin, UA: NEGATIVE
Blood, UA: NEGATIVE
Glucose, UA: NEGATIVE mg/dL
Ketones, POC UA: NEGATIVE mg/dL
Leukocytes, UA: NEGATIVE
Nitrite, UA: NEGATIVE
Protein Ur, POC: NEGATIVE mg/dL
Specific Gravity, Urine: 1.015
Urobilinogen, Ur: 0.2
pH, UA: 7.5 (ref 5.0–8.0)

## 2020-09-21 NOTE — Progress Notes (Signed)
   Subjective:    Patient ID: Kenneth Romero, male    DOB: [birth year], 70 y.o.   MRN: [id number]  HPI He is here for a recheck.  He is having no frequency, dysuria, abdominal or back pain.  Review of his record indicates he did have E. coli sensitive to everything except ampicillin.  He is scheduled to see urology.   Review of Systems     Objective:   Physical Exam Alert and in no distress.  Urinalysis was reviewed.       Assessment & Plan:  Acute cystitis without hematuria - Plan: POCT Urinalysis DIP (Proadvantage Device) Discussed urology referral and explained that it is probably safe to go ahead and keep that appointment to make sure were not missing any underlying causes.

## 2020-10-15 ENCOUNTER — Encounter: Payer: Self-pay | Admitting: Family Medicine

## 2020-10-15 ENCOUNTER — Ambulatory Visit: Payer: BC Managed Care – PPO | Admitting: Family Medicine

## 2020-10-15 ENCOUNTER — Other Ambulatory Visit: Payer: Self-pay

## 2020-10-15 VITALS — BP 118/78 | HR 63 | Temp 96.4°F | Ht 69.5 in | Wt 168.6 lb

## 2020-10-15 DIAGNOSIS — K6282 Dysplasia of anus: Secondary | ICD-10-CM

## 2020-10-15 DIAGNOSIS — H409 Unspecified glaucoma: Secondary | ICD-10-CM

## 2020-10-15 DIAGNOSIS — Z0000 Encounter for general adult medical examination without abnormal findings: Secondary | ICD-10-CM | POA: Diagnosis not present

## 2020-10-15 DIAGNOSIS — Z23 Encounter for immunization: Secondary | ICD-10-CM

## 2020-10-15 DIAGNOSIS — Z13220 Encounter for screening for lipoid disorders: Secondary | ICD-10-CM

## 2020-10-15 LAB — POCT URINALYSIS DIP (PROADVANTAGE DEVICE)
Bilirubin, UA: NEGATIVE
Blood, UA: NEGATIVE
Glucose, UA: NEGATIVE mg/dL
Ketones, POC UA: NEGATIVE mg/dL
Leukocytes, UA: NEGATIVE
Nitrite, UA: NEGATIVE
Protein Ur, POC: NEGATIVE mg/dL
Specific Gravity, Urine: 1.01
Urobilinogen, Ur: 0.2
pH, UA: 7.5 (ref 5.0–8.0)

## 2020-10-15 NOTE — Progress Notes (Signed)
   Subjective:    Patient ID: Kenneth Romero, male    DOB: 1950/12/17, 70 y.o.   MRN: [id number]  HPI He is here for complete examination.  He has had recent urologic evaluation for UTI type symptoms and urology found no major concerns.  He does have AIN with previous surgery and is followed periodically by primary for this.  He does have glaucoma and sees ophthalmology regularly for that.  He had a colonoscopy in 2020.  He has had previous AAA evaluation and will need a repeat of this in 2024.  He plans on working another couple of years and then will probably retire.  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  Family and social history as well as health maintenance and immunizations was reviewed.   Review of Systems  All other systems reviewed and are negative.      Objective:   Physical Exam Alert and in no distress. Tympanic membranes and canals are normal. Pharyngeal area is normal. Neck is supple without adenopathy or thyromegaly. Cardiac exam shows a regular sinus rhythm without murmurs or gallops. Lungs are clear to auscultation. Abdominal exam shows no masses or tenderness with normal bowel sounds Urine dipstick is negative.     Assessment & Plan:  Routine general medical examination at health care facility - Plan: POCT Urinalysis DIP (Proadvantage Device), CBC with Differential/Platelet, Comprehensive metabolic panel, Lipid panel  Need for viral immunization - Plan: PFIZER Comirnaty(GRAY TOP)COVID-19 Vaccine  Anal intraepithelial neoplasia I (AIN I)  Glaucoma, unspecified glaucoma type, unspecified laterality  Screening for lipid disorders - Plan: Lipid panel

## 2020-10-16 LAB — COMPREHENSIVE METABOLIC PANEL
ALT: 18 IU/L (ref 0–44)
AST: 19 IU/L (ref 0–40)
Albumin/Globulin Ratio: 2.9 — ABNORMAL HIGH (ref 1.2–2.2)
Albumin: 4.7 g/dL (ref 3.7–4.7)
Alkaline Phosphatase: 65 IU/L (ref 44–121)
BUN/Creatinine Ratio: 15 (ref 10–24)
BUN: 9 mg/dL (ref 8–27)
Bilirubin Total: 1 mg/dL (ref 0.0–1.2)
CO2: 21 mmol/L (ref 20–29)
Calcium: 9.3 mg/dL (ref 8.6–10.2)
Chloride: 98 mmol/L (ref 96–106)
Creatinine, Ser: 0.62 mg/dL — ABNORMAL LOW (ref 0.76–1.27)
Globulin, Total: 1.6 g/dL (ref 1.5–4.5)
Glucose: 93 mg/dL (ref 65–99)
Potassium: 4.7 mmol/L (ref 3.5–5.2)
Sodium: 134 mmol/L (ref 134–144)
Total Protein: 6.3 g/dL (ref 6.0–8.5)
eGFR: 102 mL/min/{1.73_m2} (ref >59)

## 2020-10-16 LAB — LIPID PANEL
Chol/HDL Ratio: 2.7 ratio (ref 0.0–5.0)
Cholesterol, Total: 179 mg/dL (ref 100–199)
HDL: 66 mg/dL (ref >39)
LDL Chol Calc (NIH): 100 mg/dL — ABNORMAL HIGH (ref 0–99)
Triglycerides: 67 mg/dL (ref 0–149)
VLDL Cholesterol Cal: 13 mg/dL (ref 5–40)

## 2020-10-16 LAB — CBC WITH DIFFERENTIAL/PLATELET
Basophils Absolute: 0 10*3/uL (ref 0.0–0.2)
Basos: 0 %
EOS (ABSOLUTE): 0.1 10*3/uL (ref 0.0–0.4)
Eos: 1 %
Hematocrit: 44.5 % (ref 37.5–51.0)
Hemoglobin: 15 g/dL (ref 13.0–17.7)
Immature Grans (Abs): 0 10*3/uL (ref 0.0–0.1)
Immature Granulocytes: 0 %
Lymphocytes Absolute: 1.2 10*3/uL (ref 0.7–3.1)
Lymphs: 26 %
MCH: 32.1 pg (ref 26.6–33.0)
MCHC: 33.7 g/dL (ref 31.5–35.7)
MCV: 95 fL (ref 79–97)
Monocytes Absolute: 0.5 10*3/uL (ref 0.1–0.9)
Monocytes: 11 %
Neutrophils Absolute: 2.8 10*3/uL (ref 1.4–7.0)
Neutrophils: 62 %
Platelets: 230 10*3/uL (ref 150–450)
RBC: 4.67 x10E6/uL (ref 4.14–5.80)
RDW: 12.2 % (ref 11.6–15.4)
WBC: 4.5 10*3/uL (ref 3.4–10.8)

## 2021-03-25 ENCOUNTER — Other Ambulatory Visit: Payer: Self-pay

## 2021-03-25 ENCOUNTER — Encounter: Payer: Self-pay | Admitting: Family Medicine

## 2021-03-25 ENCOUNTER — Telehealth: Payer: BC Managed Care – PPO | Admitting: Family Medicine

## 2021-03-25 VITALS — Temp 98.5°F | Wt 168.0 lb

## 2021-03-25 DIAGNOSIS — U071 COVID-19: Secondary | ICD-10-CM

## 2021-03-25 NOTE — Progress Notes (Signed)
   Subjective:    Patient ID: Kenneth Romero, male    DOB: 1951-07-02, 70 y.o.   MRN: [id number]  HPI Documentation for virtual audio and video telecommunications through [place] encounter:  The patient was located at home. 2 patient identifiers used.  The provider was located in the office. The patient did consent to this visit and is aware of possible charges through their insurance for this visit. The other persons participating in this telemedicine service were none. Time spent on call was 5 minutes and in review of previous records >15 minutes total for counseling and coordination of care. This virtual service is not related to other E/M service within previous 7 days.  He states that yesterday early in the morning he developed cough, fever, fatigue and malaise.  He was then tested yesterday and is positive.  He did get the by Valent vaccine on September 9 meaning he has had 5 shots.  Today he is still having some slight fatigue, rhinorrhea and cough but states he is feeling better.  Review of Systems     Objective:   Physical Exam Alert and in no distress otherwise not examined       Assessment & Plan:  COVID-19 Discussed being sequestered for the rest the weekend if he is feeling better by Saturday, he can then go out in public with a mask for another 5 days.  Recommend he treat his symptoms as needed.  Discussed placing him on an antiviral however he is comfortable with waiting.  If he gets worse, I will definitely call in Paxlovid

## 2021-03-27 ENCOUNTER — Telehealth: Payer: Self-pay | Admitting: Family Medicine

## 2021-03-27 DIAGNOSIS — U071 COVID-19: Secondary | ICD-10-CM

## 2021-03-27 MED ORDER — NIRMATRELVIR/RITONAVIR (PAXLOVID)TABLET: 3.0000 | ORAL_TABLET | Freq: Two times a day (BID) | ORAL | 0 refills | Status: DC

## 2021-03-27 NOTE — Telephone Encounter (Signed)
He called stating that he is still having some symptoms and now having diarrhea.  I discussed going ahead and treating him and he is comfortable with that.

## 2021-03-27 NOTE — Telephone Encounter (Signed)
Pt called he does not feel like he is getting any better  Now has diarrhea and feels very "heavy and ill"

## 2021-03-30 ENCOUNTER — Emergency Department (HOSPITAL_COMMUNITY): Payer: BC Managed Care – PPO

## 2021-03-30 ENCOUNTER — Observation Stay (HOSPITAL_COMMUNITY)
Admission: EM | Admit: 2021-03-30 | Discharge: 2021-03-31 | Disposition: A | Discharge status: Home / Self Care | Payer: BC Managed Care – PPO | Attending: Internal Medicine | Admitting: Internal Medicine

## 2021-03-30 ENCOUNTER — Other Ambulatory Visit: Payer: Self-pay

## 2021-03-30 ENCOUNTER — Encounter (HOSPITAL_COMMUNITY): Payer: Self-pay

## 2021-03-30 DIAGNOSIS — U071 COVID-19: Secondary | ICD-10-CM | POA: Diagnosis not present

## 2021-03-30 DIAGNOSIS — Z7982 Long term (current) use of aspirin: Secondary | ICD-10-CM | POA: Diagnosis not present

## 2021-03-30 DIAGNOSIS — K6282 Dysplasia of anus: Secondary | ICD-10-CM | POA: Diagnosis not present

## 2021-03-30 DIAGNOSIS — E871 Hypo-osmolality and hyponatremia: Secondary | ICD-10-CM

## 2021-03-30 DIAGNOSIS — H409 Unspecified glaucoma: Secondary | ICD-10-CM | POA: Diagnosis present

## 2021-03-30 DIAGNOSIS — R112 Nausea with vomiting, unspecified: Secondary | ICD-10-CM | POA: Diagnosis not present

## 2021-03-30 DIAGNOSIS — R079 Chest pain, unspecified: Secondary | ICD-10-CM | POA: Diagnosis not present

## 2021-03-30 DIAGNOSIS — Z87891 Personal history of nicotine dependence: Secondary | ICD-10-CM | POA: Insufficient documentation

## 2021-03-30 LAB — COMPREHENSIVE METABOLIC PANEL
ALT: 18 U/L (ref 0–44)
AST: 22 U/L (ref 15–41)
Albumin: 4.3 g/dL (ref 3.5–5.0)
Alkaline Phosphatase: 59 U/L (ref 38–126)
Anion gap: 10 (ref 5–15)
BUN: 7 mg/dL — ABNORMAL LOW (ref 8–23)
CO2: 23 mmol/L (ref 22–32)
Calcium: 9 mg/dL (ref 8.9–10.3)
Chloride: 87 mmol/L — ABNORMAL LOW (ref 98–111)
Creatinine, Ser: 0.4 mg/dL — ABNORMAL LOW (ref 0.61–1.24)
GFR, Estimated: >60 mL/min (ref >60)
Glucose, Bld: 118 mg/dL — ABNORMAL HIGH (ref 70–99)
Potassium: 3.8 mmol/L (ref 3.5–5.1)
Sodium: 120 mmol/L — ABNORMAL LOW (ref 135–145)
Total Bilirubin: 1.5 mg/dL — ABNORMAL HIGH (ref 0.3–1.2)
Total Protein: 6.5 g/dL (ref 6.5–8.1)

## 2021-03-30 LAB — TROPONIN I (HIGH SENSITIVITY)
Troponin I (High Sensitivity): 4 ng/L (ref <18)
Troponin I (High Sensitivity): 6 ng/L (ref <18)

## 2021-03-30 LAB — URINALYSIS, ROUTINE W REFLEX MICROSCOPIC
Bacteria, UA: NONE SEEN
Bilirubin Urine: NEGATIVE
Glucose, UA: NEGATIVE mg/dL
Hgb urine dipstick: NEGATIVE
Ketones, ur: NEGATIVE mg/dL
Leukocytes,Ua: NEGATIVE
Nitrite: NEGATIVE
Protein, ur: NEGATIVE mg/dL
Specific Gravity, Urine: 1.01 (ref 1.005–1.030)
pH: 7.5 (ref 5.0–8.0)

## 2021-03-30 LAB — CBC
HCT: 41.3 % (ref 39.0–52.0)
Hemoglobin: 15.3 g/dL (ref 13.0–17.0)
MCH: 32.3 pg (ref 26.0–34.0)
MCHC: 37 g/dL — ABNORMAL HIGH (ref 30.0–36.0)
MCV: 87.3 fL (ref 80.0–100.0)
Platelets: 234 10*3/uL (ref 150–400)
RBC: 4.73 MIL/uL (ref 4.22–5.81)
RDW: 11.5 % (ref 11.5–15.5)
WBC: 4.7 10*3/uL (ref 4.0–10.5)
nRBC: 0 % (ref 0.0–0.2)

## 2021-03-30 LAB — OSMOLALITY, URINE: Osmolality, Ur: 251 mOsm/kg — ABNORMAL LOW (ref 300–900)

## 2021-03-30 LAB — BASIC METABOLIC PANEL
Anion gap: 8 (ref 5–15)
BUN: 6 mg/dL — ABNORMAL LOW (ref 8–23)
CO2: 23 mmol/L (ref 22–32)
Calcium: 8.9 mg/dL (ref 8.9–10.3)
Chloride: 92 mmol/L — ABNORMAL LOW (ref 98–111)
Creatinine, Ser: 0.44 mg/dL — ABNORMAL LOW (ref 0.61–1.24)
GFR, Estimated: >60 mL/min (ref >60)
Glucose, Bld: 108 mg/dL — ABNORMAL HIGH (ref 70–99)
Potassium: 3.7 mmol/L (ref 3.5–5.1)
Sodium: 123 mmol/L — ABNORMAL LOW (ref 135–145)

## 2021-03-30 LAB — NA AND K (SODIUM & POTASSIUM), RAND UR
Potassium Urine: 20 mmol/L
Sodium, Ur: 69 mmol/L

## 2021-03-30 LAB — MAGNESIUM: Magnesium: 1.7 mg/dL (ref 1.7–2.4)

## 2021-03-30 LAB — LIPASE, BLOOD: Lipase: 25 U/L (ref 11–51)

## 2021-03-30 LAB — TSH: TSH: 4.321 u[IU]/mL (ref 0.350–4.500)

## 2021-03-30 LAB — D-DIMER, QUANTITATIVE: D-Dimer, Quant: <0.27 ug/mL-FEU (ref 0.00–0.50)

## 2021-03-30 MED ORDER — ACETAMINOPHEN 325 MG PO TABS
650.0000 mg | ORAL_TABLET | Freq: Once | ORAL | Status: AC
  Administered 2021-03-30: 650 mg via ORAL
  Filled 2021-03-30: qty 2

## 2021-03-30 MED ORDER — SODIUM CHLORIDE 0.9 % IV SOLN
INTRAVENOUS | Status: AC
Start: 2021-03-30 — End: 2021-03-31

## 2021-03-30 MED ORDER — ENOXAPARIN SODIUM 40 MG/0.4ML IJ SOSY
40.0000 mg | PREFILLED_SYRINGE | INTRAMUSCULAR | Status: DC
  Administered 2021-03-30: 40 mg via SUBCUTANEOUS
  Filled 2021-03-30: qty 0.4

## 2021-03-30 MED ORDER — ALUM & MAG HYDROXIDE-SIMETH 200-200-20 MG/5ML PO SUSP
30.0000 mL | Freq: Once | ORAL | Status: AC
  Administered 2021-03-30: 30 mL via ORAL
  Filled 2021-03-30: qty 30

## 2021-03-30 MED ORDER — LACTATED RINGERS IV BOLUS
1000.0000 mL | Freq: Once | INTRAVENOUS | Status: AC
  Administered 2021-03-30: 1000 mL via INTRAVENOUS

## 2021-03-30 NOTE — ED Triage Notes (Signed)
Patient states he ws Covid + 6 days ago.  Patient c/o fatigue, abdominal pain and vomiting.

## 2021-03-30 NOTE — ED Provider Notes (Signed)
[name] Provider Note   CSN: [id number] Arrival date & time: 03/30/21  1440     History Chief Complaint  Patient presents with   Abdominal Pain   Emesis   Covid Positive    Kenneth Romero is a 70 y.o. male.   Abdominal Pain Associated symptoms: fatigue, nausea and vomiting   Associated symptoms: no chills, no constipation, no cough, no diarrhea, no dysuria, no fever, no hematuria, no shortness of breath and no sore throat   Emesis Associated symptoms: abdominal pain (described as "burning")   Associated symptoms: no arthralgias, no chills, no cough, no diarrhea, no fever, no myalgias and no sore throat   Patient is a 70 year old male with no known history of chronic cardiac or lung abnormalities.  He was diagnosed with COVID 6 days ago.  He was initiated on Paxlovid 3 days ago.  He reports that over the past 2 days, he has had a burning sensation in his abdomen and chest.  He spoke with his primary care doctor who advised him to come to the ED.  He had 1 episode of vomiting this morning.  He has not eaten any food since yesterday due to this burning sensation and for fear that he might vomit.  He describes current symptoms as not quite nausea but a queasiness and burning.  He denies any history of GERD.  Currently he denies any difficulty with breathing.  He previously had a cough which he says has resolved.  He does not take any daily medications other than eyedrops.    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 No  pertinent family history.  Social History   Tobacco Use   Smoking status: Former    Types: Cigarettes    Quit date: 07/07/1972    Years since quitting: 48.7   Smokeless tobacco: Never  Vaping Use   Vaping Use: Never used  Substance Use Topics   Alcohol use: Yes    Alcohol/week: 3.0 standard drinks    Types: 3 Glasses of wine per week   Drug use: No    Home Medications Prior to Admission medications   Medication Sig Start Date End Date Taking? Authorizing Provider  Ascorbic Acid (VITAMIN C) 1000 MG tablet Take 1,000 mg by mouth daily.   Yes [provider]  aspirin 81 MG tablet Take 81 mg by mouth daily.   Yes [provider]  fish oil-omega-3 fatty acids 1000 MG capsule Take 2 g by mouth daily.   Yes [provider]  Garlic 433 MG TABS Take 1 tablet by mouth 1 day or 1 dose.   Yes [provider]  milk thistle 175 MG tablet Take 175 mg by mouth daily.   Yes [provider]  Multiple Vitamins-Minerals (MULTIVITAMIN WITH MINERALS) tablet Take 1 tablet by mouth daily.   Yes [provider]  nirmatrelvir/ritonavir EUA (PAXLOVID) 20 x 150 MG & 10 x 100MG  TABS Take 3 tablets by mouth 2 (  two) times daily for 5 days. (Take nirmatrelvir 150 mg two tablets twice daily for 5 days and ritonavir 100 mg one tablet twice daily for 5 days) Patient GFR is 102 03/27/21 04/01/21 Yes Denita Lung, MD  VYZULTA 0.024 % SOLN Apply 1 drop to eye at bedtime. 08/22/19  Yes [provider]  ciprofloxacin (CIPRO) 500 MG tablet Take 1 tablet (500 mg total) by mouth 2 (two) times daily. Patient not taking: No sig reported 08/27/20   Tysinger, Camelia Eng, PA-C  latanoprost (XALATAN) 0.005 % ophthalmic solution  10/14/13   [provider]  naproxen sodium (ALEVE) 220 MG tablet Take 220 mg by mouth as needed.    [provider]    Allergies    Sulfa antibiotics  Review of Systems   Review of Systems  Constitutional:  Positive for  appetite change and fatigue. Negative for chills and fever.  HENT:  Negative for congestion, ear pain, sinus pain, sore throat and trouble swallowing.   Eyes:  Negative for pain and visual disturbance.  Respiratory:  Negative for cough, shortness of breath and wheezing.        Chest "burning"   Cardiovascular:  Negative for palpitations.  Gastrointestinal:  Positive for abdominal pain (described as "burning"), nausea and vomiting. Negative for constipation and diarrhea.  Genitourinary:  Negative for dysuria, flank pain and hematuria.  Musculoskeletal:  Negative for arthralgias, back pain, gait problem, myalgias and neck pain.  Skin:  Negative for color change and rash.  Neurological:  Negative for dizziness, seizures, syncope, weakness, light-headedness and numbness.  Hematological:  Does not bruise/bleed easily.  Psychiatric/Behavioral:  Negative for confusion and decreased concentration.   All other systems reviewed and are negative.  Physical Exam Updated Vital Signs BP 133/85 (BP Location: Left Arm)   Pulse (!) 56   Temp 98.7 F (37.1 C) (Oral)   Resp 20   Ht 6' (1.829 m)   Wt 79.4 kg   SpO2 97%   BMI 23.73 kg/m   Physical Exam Vitals and nursing note reviewed.  Constitutional:      General: He is not in acute distress.    Appearance: He is well-developed and normal weight. He is not ill-appearing, toxic-appearing or diaphoretic.  HENT:     Head: Normocephalic and atraumatic.     Mouth/Throat:     Pharynx: Oropharynx is clear.  Eyes:     Conjunctiva/sclera: Conjunctivae normal.  Cardiovascular:     Rate and Rhythm: Normal rate and regular rhythm.     Heart sounds: No murmur heard. Pulmonary:     Effort: Pulmonary effort is normal. No respiratory distress.     Breath sounds: Normal breath sounds. No wheezing or rales.  Abdominal:     Palpations: Abdomen is soft.     Tenderness: There is no abdominal tenderness.  Musculoskeletal:     Cervical back: Neck supple.   Skin:    General: Skin is warm and dry.  Neurological:     General: No focal deficit present.     Mental Status: He is alert and oriented to person, place, and time.     Cranial Nerves: No cranial nerve deficit.     Motor: No weakness.  Psychiatric:        Mood and Affect: Mood normal.        Behavior: Behavior normal.    ED Results / Procedures / Treatments   Labs (all labs ordered are listed, but only abnormal results are displayed) Labs Reviewed  SARS CORONAVIRUS 2 (TAT 6-24 HRS) - Abnormal; Notable for the following components:      Result Value   SARS Coronavirus 2 POSITIVE (*)    All other components within normal limits  COMPREHENSIVE METABOLIC PANEL - Abnormal; Notable for the following components:   Sodium 120 (*)    Chloride 87 (*)    Glucose, Bld 118 (*)    BUN 7 (*)    Creatinine, Ser 0.40 (*)    Total Bilirubin 1.5 (*)    All other components within normal limits  CBC - Abnormal; Notable for the following components:   MCHC 37.0 (*)    All other components within normal limits  URINALYSIS, ROUTINE W REFLEX MICROSCOPIC - Abnormal; Notable for the following components:   Color, Urine YELLOW (*)    APPearance CLEAR (*)    All other components within normal limits  OSMOLALITY, URINE - Abnormal; Notable for the following components:   Osmolality, Ur 251 (*)    All other components within normal limits  OSMOLALITY - Abnormal; Notable for the following components:   Osmolality 246 (*)    All other components within normal limits  BASIC METABOLIC PANEL - Abnormal; Notable for the following components:   Sodium 123 (*)    Chloride 92 (*)    Glucose, Bld 108 (*)    BUN 6 (*)    Creatinine, Ser 0.44 (*)    All other components within normal limits  COMPREHENSIVE METABOLIC PANEL - Abnormal; Notable for the following components:   Sodium 127 (*)    Chloride 92 (*)    Glucose, Bld 105 (*)    BUN 6 (*)    Total Protein 6.2 (*)    Total Bilirubin 1.4 (*)    All  other components within normal limits  CBC - Abnormal; Notable for the following components:   MCHC 36.7 (*)    All other components within normal limits  LIPASE, BLOOD  MAGNESIUM  D-DIMER, QUANTITATIVE  TSH  NA AND K (SODIUM & POTASSIUM), RAND UR  TROPONIN I (HIGH SENSITIVITY)  TROPONIN I (HIGH SENSITIVITY)    EKG EKG Interpretation  Date/Time:  Saturday March 30 2021 16:01:31 EDT Ventricular Rate:  57 PR Interval:  172 QRS Duration: 89 QT Interval:  425 QTC Calculation: 414 R Axis:     Text Interpretation: Sinus rhythm Confirmed by Godfrey Pick [phone]) on 03/30/2021 4:52:04 PM  Radiology DG Chest Portable 1 View  Result Date: 03/30/2021 CLINICAL DATA:  Chest pain. EXAM: PORTABLE CHEST 1 VIEW COMPARISON:  April 02, 2007. FINDINGS: The heart size and mediastinal contours are within normal limits. Both lungs are clear. The visualized skeletal structures are unremarkable. IMPRESSION: No active disease. Electronically Signed   By: Marijo Conception M.D.   On: 03/30/2021 15:36    Procedures Procedures   Medications Ordered in ED Medications  enoxaparin (LOVENOX) injection 40 mg (40 mg Subcutaneous Given 03/30/21 2145)  0.9 %  sodium chloride infusion ( Intravenous New Bag/Given 03/30/21 2248)  aspirin EC tablet 81 mg (81 mg Oral Given 03/31/21 0910)  omega-3 acid ethyl esters (LOVAZA) capsule 2 g (2 g Oral Given 03/31/21 0910)  latanoprost (XALATAN) 0.005 % ophthalmic solution 1 drop (1 drop Both Eyes Not Given 03/31/21 0300)  acetaminophen (TYLENOL) tablet 650 mg (650 mg Oral Given 03/31/21 1000)  ondansetron (ZOFRAN) tablet 4 mg (4 mg Oral Given 03/31/21 1000)  lactated ringers bolus 1,000 mL (1,000 mLs Intravenous Bolus 03/30/21 1553)  acetaminophen (TYLENOL) tablet  650 mg (650 mg Oral Given 03/30/21 1548)  alum & mag hydroxide-simeth (MAALOX/MYLANTA) 200-200-20 MG/5ML suspension 30 mL (30 mLs Oral Given 03/30/21 2145)    ED Course  I have reviewed the triage vital signs and  the nursing notes.  Pertinent labs & imaging results that were available during my care of the patient were reviewed by me and considered in my medical decision making (see chart for details).    MDM Rules/Calculators/A&P                         CRITICAL CARE Performed by: Godfrey Pick   Total critical care time: 35 minutes  Critical care time was exclusive of separately billable procedures and treating other patients.  Critical care was necessary to treat or prevent imminent or life-threatening deterioration.  Critical care was time spent personally by me on the following activities: development of treatment plan with patient and/or surrogate as well as nursing, discussions with consultants, evaluation of patient's response to treatment, examination of patient, obtaining history from patient or surrogate, ordering and performing treatments and interventions, ordering and review of laboratory studies, ordering and review of radiographic studies, pulse oximetry and re-evaluation of patient's condition.   Patient presents for burning sensation in his chest and abdomen in the setting of a current COVID infection.  Vital signs on arrival notable for normothermia, normal heart rate, and moderate hypertension.  Breathing is even and unlabored.  SPO2 is normal on room air.  Lungs are clear to auscultation.  Chest and abdomen is without tenderness.  Work-up initiated for cardiopulmonary abnormality.  EKG shows no evidence of ischemia.  Given his poor p.o. intake, bolus of IV fluids was ordered.  Laboratory work-up was notable for hyponatremia.  Patient's sodium was 120.  Per chart review, he does not have a history of hyponatremia.  This could be secondary to decreased p.o. intake of food with increased intake of free water.  Further urine studies and serum studies were ordered to further identify other possible causes of his hyponatremia.  Hyponatremia could be the cause of his abdominal discomfort.   During his time in the ED, his discomfort did not worsen.  Patient was advised of his hyponatremia.  He was agreeable to admission.  Repeat BMP was ordered.  Patient was admitted to hospitalist for further management.  Final Clinical Impression(s) / ED Diagnoses Final diagnoses:  Hyponatremia    Rx / DC Orders ED Discharge Orders     None        Godfrey Pick, MD 03/31/21 1253

## 2021-03-30 NOTE — H&P (Addendum)
History and Physical    KARI KERTH PNT:[id number] DOB: 11-Dec-1950 DOA: 03/30/2021  PCP: Denita Lung, MD  Patient coming from: Home  I have personally briefly reviewed patient's old medical records in [place]  Chief Complaint: Abdominal pain, nausea vomiting  HPI: Kenneth Romero is a 70 y.o. male with medical history significant for anal intraepithelial neoplasm, Glaucoma, recent COVID infection who presents concerns of abdominal burning, nausea and vomiting.   Patient was COVID-positive 6 days ago.  Initially had coughing and fatigue.  He then started to feel worse and had worsening fatigue and was prescribed Paxlovid by PCP. He has since felt nauseous with upper quadrant abdominal burning pain as well as sometimes pain to his upper back and chest.  This morning he had an episode of vomiting.  Has not been able to eat since yesterday but has been drinking up to 5 to 6 glasses of water which is more than his usual. Feels more unsteady on his feet. He denies any cough.  Had episode of labored respiration yesterday but that has resolved.  Had regular bowel movement this morning.  Denies any history of abdominal surgery.  Has quit tobacco use over 50 years ago.  Occasional alcohol use but denies illicit drug use.   ED Course: He was afebrile, bradycardic with heart rate in the 50s, mildly hypertensive to systolic of 086P over 99, RR of 27.  No leukocytosis or anemia.  Sodium of 120, potassium of 3.8, chloride of 87, creatinine of 0.4, BG of 118.  LFTs were negative.  D-dimer is negative.  Troponin of 4.  He has received 1 L of LR in the ED with repeat BMP pending.  Hospitalist on-call for admission for hyponatremia.  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  He has formal tobacco use over 50 years ago.  Occasional alcohol but denies illicit drug use   Past Medical History:  Diagnosis Date   Anal intraepithelial neoplasia I (AIN I)    Vitiligo     Past Surgical History:  Procedure Laterality Date   ANUS SURGERY     COLONOSCOPY  07/07/2004   DR.HUNG   SIGMOIDOSCOPY  07/07/2008   DR.HUNG     reports that he quit smoking about 48 years ago. His smoking use included cigarettes. He has never used smokeless tobacco. He reports current alcohol use of about 3.0 standard drinks per week. He reports that he does not use drugs. Social History  Allergies  Allergen Reactions   Sulfa Antibiotics        Prior to Admission medications   Medication Sig Start Date End Date Taking? Authorizing Provider  Ascorbic Acid (VITAMIN C) 1000 MG tablet Take 1,000 mg by mouth daily.    [provider]  aspirin 81 MG tablet Take 81 mg by mouth daily.    [provider]  ciprofloxacin (CIPRO) 500 MG tablet Take 1 tablet (500 mg total) by mouth 2 (two) times daily. Patient not taking: No sig  reported 08/27/20   Tysinger, Camelia Eng, PA-C  fish oil-omega-3 fatty acids 1000 MG capsule Take 2 g by mouth daily.    [provider]  Garlic 098 MG TABS Take 1 tablet by mouth 1 day or 1 dose.    [provider]  latanoprost (XALATAN) 0.005 % ophthalmic solution  10/14/13   [provider]  milk thistle 175 MG tablet Take 175 mg by mouth daily.    [provider]  Multiple Vitamins-Minerals (MULTIVITAMIN WITH MINERALS) tablet Take 1 tablet by mouth daily.    [provider]  naproxen sodium (ALEVE) 220 MG tablet Take 220 mg by mouth as needed.    [provider]  nirmatrelvir/ritonavir EUA (PAXLOVID) 20 x 150 MG & 10 x 100MG  TABS Take 3 tablets by mouth 2 (two) times daily for 5 days. (Take nirmatrelvir 150 mg two tablets twice daily for 5 days and ritonavir 100 mg one tablet twice daily for 5 days) Patient GFR is 102 03/27/21 04/01/21  Denita Lung, MD  VYZULTA 0.024 % SOLN Apply 1 drop to eye at bedtime. 08/22/19   [provider]    Physical Exam: Vitals:   03/30/21 1700 03/30/21 1730 03/30/21 1800 03/30/21 1830  BP: (!) 156/86 (!) 158/90 (!) 169/94 (!) 151/86  Pulse: (!) 57 (!) 59 (!) 57 (!) 57  Resp: 20 (!) 27 (!) 22 17  Temp:      TempSrc:      SpO2: 99% 99% 97% 97%  Weight:      Height:        Constitutional: NAD, calm, comfortable, nontoxic appearing elderly gentleman sitting upright in bed wearing N95 mask Vitals:   03/30/21 1700 03/30/21 1730 03/30/21 1800 03/30/21 1830  BP: (!) 156/86 (!) 158/90 (!) 169/94 (!) 151/86  Pulse: (!) 57 (!) 59 (!) 57 (!) 57  Resp: 20 (!) 27 (!) 22 17  Temp:      TempSrc:      SpO2: 99% 99% 97% 97%  Weight:      Height:       Eyes: PERRL, lids and conjunctivae normal ENMT: Mucous membranes are moist.  Neck: normal, supple Respiratory: clear to auscultation bilaterally, no wheezing, no crackles. Normal respiratory effort. No accessory muscle use.  Cardiovascular: Regular rate and rhythm, no murmurs / rubs / gallops. No extremity edema.  Abdomen: Soft, nondistended, no tenderness, no masses palpated. Bowel sounds positive.  Musculoskeletal: no clubbing / cyanosis. No joint deformity upper and lower extremities. Good ROM, no contractures. Normal muscle tone.  Skin: no rashes, lesions, ulcers. No induration Neurologic: CN 2-12 grossly intact. Sensation intact,. Strength 5/5 in all 4.  Psychiatric: Normal judgment and insight. Alert and oriented x 3. Normal mood.    Labs on  Admission: I have personally reviewed following labs and imaging studies  CBC: Recent Labs  Lab 03/30/21 1607  WBC 4.7  HGB 15.3  HCT 41.3  MCV 87.3  PLT 119   Basic Metabolic Panel: Recent Labs  Lab 03/30/21 1607 03/30/21 1608  NA 120*  --   K 3.8  --   CL 87*  --   CO2 23  --   GLUCOSE 118*  --   BUN 7*  --   CREATININE 0.40*  --   CALCIUM 9.0  --   MG  --  1.7   GFR: Estimated Creatinine Clearance: 91.6 mL/min (A) (by C-G formula based on SCr of 0.4 mg/dL (L)). Liver Function Tests: Recent  Labs  Lab 03/30/21 1607  AST 22  ALT 18  ALKPHOS 59  BILITOT 1.5*  PROT 6.5  ALBUMIN 4.3   Recent Labs  Lab 03/30/21 1607  LIPASE 25   No results for input(s): AMMONIA in the last 168 hours. Coagulation Profile: No results for input(s): INR, PROTIME in the last 168 hours. Cardiac Enzymes: No results for input(s): CKTOTAL, CKMB, CKMBINDEX, TROPONINI in the last 168 hours. BNP (last 3 results) No results for input(s): PROBNP in the last 8760 hours. HbA1C: No results for input(s): HGBA1C in the last 72 hours. CBG: No results for input(s): GLUCAP in the last 168 hours. Lipid Profile: No results for input(s): CHOL, HDL, LDLCALC, TRIG, CHOLHDL, LDLDIRECT in the last 72 hours. Thyroid Function Tests: No results for input(s): TSH, T4TOTAL, FREET4, T3FREE, THYROIDAB in the last 72 hours. Anemia Panel: No results for input(s): VITAMINB12, FOLATE, FERRITIN, TIBC, IRON, RETICCTPCT in the last 72 hours. Urine analysis:    Component Value Date/Time   LABSPEC 1.010 10/15/2020 1020   BILIRUBINUR negative 10/15/2020 1020   BILIRUBINUR n 04/18/2016 1013   KETONESUR negative 10/15/2020 1020   PROTEINUR negative 10/15/2020 1020   PROTEINUR n 04/18/2016 1013   UROBILINOGEN negative 04/18/2016 1013   NITRITE Negative 10/15/2020 1020   NITRITE n 04/18/2016 1013   LEUKOCYTESUR Negative 10/15/2020 1020    Radiological Exams on Admission: DG Chest Portable 1 View  Result  Date: 03/30/2021 CLINICAL DATA:  Chest pain. EXAM: PORTABLE CHEST 1 VIEW COMPARISON:  April 02, 2007. FINDINGS: The heart size and mediastinal contours are within normal limits. Both lungs are clear. The visualized skeletal structures are unremarkable. IMPRESSION: No active disease. Electronically Signed   By: Marijo Conception M.D.   On: 03/30/2021 15:36      Assessment/Plan  Hyponatremia in the setting of COVID-19 viral infection  -Na of 120 on admit -pt reports drinking more than usual in the last few days. Chloride is also low which could be suggestive of overhydration or SIADH but repeat Na after 1 L of LR in ED shows improvement to 123. Will continue gentle IV 50cc/hr fluid and follow with repeat in the morning. Avoid greater than 40meQ correction in 24 hours.  -urine Na and osm labs are pending   COVID-19 viral infection  -Patient feels that his symptoms are due to start Paxlovid and wish to discontinue. I agree that with his lack of comorbidies the risk outweights benefit at this time.  -continue airborne and contact precaution  Abdominal pain/Chest pain -Suspect due to COVID-19 viral infection.  Cardio work-up with troponin, EKG and D-dimer is reassuring.  Abdomen also benign on exam. -trial of GI cocktail  History of anal intraepithelial neoplasm - Diagnosed in 2008 and is status postresection.  Patient follows routinely with primary.    Glaucoma -continue home eyedrop   DVT prophylaxis:.Lovenox Code Status: Full Family Communication: Plan discussed with patient at bedside  disposition Plan: Home with observation Consults called:  Admission status: Observation  Level of care: Telemetry  Status is: Observation  The patient remains OBS appropriate and will d/c before 2 midnights.  Dispo: The patient is from: Home              Anticipated d/c is to: Home              Patient currently is not medically stable to d/c.   Difficult to place patient No          Orene Desanctis DO  [facility]   If 7PM-7AM, please contact night-coverage [email or web address]   03/30/2021, 6:47 PM

## 2021-03-30 NOTE — Plan of Care (Signed)
Patient will remain free from falls and injury throughout shift °

## 2021-03-31 DIAGNOSIS — E871 Hypo-osmolality and hyponatremia: Secondary | ICD-10-CM | POA: Diagnosis not present

## 2021-03-31 LAB — COMPREHENSIVE METABOLIC PANEL
ALT: 18 U/L (ref 0–44)
AST: 20 U/L (ref 15–41)
Albumin: 3.9 g/dL (ref 3.5–5.0)
Alkaline Phosphatase: 54 U/L (ref 38–126)
Anion gap: 8 (ref 5–15)
BUN: 6 mg/dL — ABNORMAL LOW (ref 8–23)
CO2: 27 mmol/L (ref 22–32)
Calcium: 9.1 mg/dL (ref 8.9–10.3)
Chloride: 92 mmol/L — ABNORMAL LOW (ref 98–111)
Creatinine, Ser: 0.63 mg/dL (ref 0.61–1.24)
GFR, Estimated: >60 mL/min (ref >60)
Glucose, Bld: 105 mg/dL — ABNORMAL HIGH (ref 70–99)
Potassium: 3.7 mmol/L (ref 3.5–5.1)
Sodium: 127 mmol/L — ABNORMAL LOW (ref 135–145)
Total Bilirubin: 1.4 mg/dL — ABNORMAL HIGH (ref 0.3–1.2)
Total Protein: 6.2 g/dL — ABNORMAL LOW (ref 6.5–8.1)

## 2021-03-31 LAB — CBC
HCT: 40.6 % (ref 39.0–52.0)
Hemoglobin: 14.9 g/dL (ref 13.0–17.0)
MCH: 32 pg (ref 26.0–34.0)
MCHC: 36.7 g/dL — ABNORMAL HIGH (ref 30.0–36.0)
MCV: 87.1 fL (ref 80.0–100.0)
Platelets: 224 10*3/uL (ref 150–400)
RBC: 4.66 MIL/uL (ref 4.22–5.81)
RDW: 11.5 % (ref 11.5–15.5)
WBC: 5.8 10*3/uL (ref 4.0–10.5)
nRBC: 0 % (ref 0.0–0.2)

## 2021-03-31 LAB — OSMOLALITY: Osmolality: 246 mOsm/kg — CL (ref 275–295)

## 2021-03-31 LAB — SARS CORONAVIRUS 2 (TAT 6-24 HRS): SARS Coronavirus 2: POSITIVE — AB

## 2021-03-31 MED ORDER — ONDANSETRON HCL 4 MG PO TABS: 4.0000 mg | ORAL_TABLET | Freq: Every day | ORAL | 1 refills | Status: DC | PRN

## 2021-03-31 MED ORDER — ONDANSETRON HCL 4 MG PO TABS
4.0000 mg | ORAL_TABLET | Freq: Four times a day (QID) | ORAL | Status: DC | PRN
  Administered 2021-03-31: 4 mg via ORAL
  Filled 2021-03-31: qty 1

## 2021-03-31 MED ORDER — OMEGA-3 FATTY ACIDS 1000 MG PO CAPS: 2.0000 g | ORAL_CAPSULE | Freq: Every day | ORAL | Status: DC

## 2021-03-31 MED ORDER — ASPIRIN EC 81 MG PO TBEC
81.0000 mg | DELAYED_RELEASE_TABLET | Freq: Every day | ORAL | Status: DC
  Administered 2021-03-31: 81 mg via ORAL
  Filled 2021-03-31: qty 1

## 2021-03-31 MED ORDER — LATANOPROSTENE BUNOD 0.024 % OP SOLN: 1.0000 [drp] | Freq: Every day | OPHTHALMIC | Status: DC

## 2021-03-31 MED ORDER — ACETAMINOPHEN 325 MG PO TABS
650.0000 mg | ORAL_TABLET | Freq: Four times a day (QID) | ORAL | Status: DC | PRN
  Administered 2021-03-31: 650 mg via ORAL
  Filled 2021-03-31: qty 2

## 2021-03-31 MED ORDER — ACETAMINOPHEN 325 MG PO TABS: 650.0000 mg | ORAL_TABLET | Freq: Four times a day (QID) | ORAL | 0 refills | Status: AC | PRN

## 2021-03-31 MED ORDER — OMEGA-3-ACID ETHYL ESTERS 1 G PO CAPS
2.0000 g | ORAL_CAPSULE | Freq: Every day | ORAL | Status: DC
  Administered 2021-03-31: 2 g via ORAL
  Filled 2021-03-31: qty 2

## 2021-03-31 MED ORDER — LATANOPROST 0.005 % OP SOLN
1.0000 [drp] | Freq: Every day | OPHTHALMIC | Status: DC
  Filled 2021-03-31: qty 2.5

## 2021-03-31 NOTE — Progress Notes (Signed)
2493 Lab reported Serum Osmolality 246. MD messaged and aware. No new orders

## 2021-03-31 NOTE — Plan of Care (Signed)

## 2021-03-31 NOTE — Discharge Summary (Signed)
Physician Discharge Summary  [name]:[id number] DOB: Oct 30, 1950 DOA: 03/30/2021  PCP: Denita Lung, MD  Admit date: 03/30/2021 Discharge date: 03/31/2021  Admitted From: Home Disposition:  Home  Recommendations for Outpatient Follow-up:  Follow up with PCP in 1-2 weeks Please obtain BMP/CBC in one week  Discharge Condition:Stable  CODE STATUS:Full  Diet recommendation: Soft/bland diet    Brief/Interim Summary: Kenneth Romero is a 70 y.o. male with medical history significant for anal intraepithelial neoplasm, Glaucoma, recent COVID infection who presents concerns of abdominal burning, nausea and vomiting found to have profound hyponatremia likely the cause of his worsening condition while concurrently recovering from recent covid infection. He remains under quarantine as discussed for 10 days since onset of symptoms/testing (whichever came first). Sodium resolved and tolerating PO quite well currently - otherwise stable and agreeable for discharge home  Discharge Instructions  Discharge Instructions     Diet - low sodium heart healthy   Complete by: As directed    Increase activity slowly   Complete by: As directed       Allergies as of 03/31/2021       Reactions   Sulfa Antibiotics Other (See Comments)   unknown        Medication List     STOP taking these medications    ciprofloxacin 500 MG tablet Commonly known as: Cipro   latanoprost 0.005 % ophthalmic solution Commonly known as: XALATAN   nirmatrelvir/ritonavir EUA 20 x 150 MG & 10 x 100MG  Tabs Commonly known as: PAXLOVID       TAKE these medications    acetaminophen 325 MG tablet Commonly known as: TYLENOL Take 2 tablets (650 mg total) by mouth every 6 (six) hours as needed for headache.   aspirin 81 MG tablet Take 81 mg by mouth daily.   fish oil-omega-3 fatty acids 1000 MG capsule Take 2 g by mouth daily.   Garlic 956 MG Tabs Take 1 tablet by mouth 1 day or 1 dose.   milk  thistle 175 MG tablet Take 175 mg by mouth daily.   multivitamin with minerals tablet Take 1 tablet by mouth daily.   naproxen sodium 220 MG tablet Commonly known as: ALEVE Take 220 mg by mouth as needed.   ondansetron 4 MG tablet Commonly known as: Zofran Take 1 tablet (4 mg total) by mouth daily as needed for nausea or vomiting.   vitamin C 1000 MG tablet Take 1,000 mg by mouth daily.   Vyzulta 0.024 % Soln Generic drug: Latanoprostene Bunod Apply 1 drop to eye at bedtime.        Allergies  Allergen Reactions   Sulfa Antibiotics Other (See Comments)    unknown    Consultations: None  Procedures/Studies: DG Chest Portable 1 View  Result Date: 03/30/2021 CLINICAL DATA:  Chest pain. EXAM: PORTABLE CHEST 1 VIEW COMPARISON:  April 02, 2007. FINDINGS: The heart size and mediastinal contours are within normal limits. Both lungs are clear. The visualized skeletal structures are unremarkable. IMPRESSION: No active disease. Electronically Signed   By: Marijo Conception M.D.   On: 03/30/2021 15:36     Subjective: No acute issues/events overnight - tolerating PO well   Discharge Exam: Vitals:   03/31/21 0839 03/31/21 1537  BP: 133/85   Pulse: (!) 56   Resp: 20 20  Temp: 98.7 F (37.1 C) 98.8 F (37.1 C)  SpO2: 97%    Vitals:   03/30/21 2331 03/31/21 0415 03/31/21 0839 03/31/21 1537  BP: (!) 143/85 (!) 147/88 133/85   Pulse: (!) 51 (!) 50 (!) 56   Resp: 16 16 20 20   Temp: 98.2 F (36.8 C) 98.2 F (36.8 C) 98.7 F (37.1 C) 98.8 F (37.1 C)  TempSrc: Oral Oral Oral Oral  SpO2:   97%   Weight:      Height:        General: Pt is alert, awake, not in acute distress Cardiovascular: RRR, S1/S2 +, no rubs, no gallops Respiratory: CTA bilaterally, no wheezing, no rhonchi Abdominal: Soft, NT, ND, bowel sounds + Extremities: no edema, no cyanosis    The results of significant diagnostics from this hospitalization (including imaging, microbiology, ancillary  and laboratory) are listed below for reference.     Microbiology: Recent Results (from the past 240 hour(s))  SARS CORONAVIRUS 2 (TAT 6-24 HRS) Nasopharyngeal Nasopharyngeal Swab     Status: Abnormal   Collection Time: 03/30/21  9:50 PM   Specimen: Nasopharyngeal Swab  Result Value Ref Range Status   SARS Coronavirus 2 POSITIVE (A) NEGATIVE Final    Comment: (NOTE) SARS-CoV-2 target nucleic acids are DETECTED.  The SARS-CoV-2 RNA is generally detectable in upper and lower respiratory specimens during the acute phase of infection. Positive results are indicative of the presence of SARS-CoV-2 RNA. Clinical correlation with patient history and other diagnostic information is  necessary to determine patient infection status. Positive results do not rule out bacterial infection or co-infection with other viruses.  The expected result is Negative.  Fact Sheet for Patients: SugarRoll.be  Fact Sheet for Healthcare Providers: https://www.woods-mathews.com/  This test is not yet approved or cleared by the [place] FDA and  has been authorized for detection and/or diagnosis of SARS-CoV-2 by FDA under an Emergency Use Authorization (EUA). This EUA will remain  in effect (meaning this test can be used) for the duration of the COVID-19 declaration under Section 564(b)(1) of the Act, 21 U. S.C. section 360bbb-3(b)(1), unless the authorization is terminated or revoked sooner.   Performed at [facility], [place] [address], [place], [place] [address]      Labs: BNP (last 3 results) No results for input(s): BNP in the last 8760 hours. Basic Metabolic Panel: Recent Labs  Lab 03/30/21 1607 03/30/21 1608 03/30/21 2131 03/31/21 0317  NA 120*  --  123* 127*  K 3.8  --  3.7 3.7  CL 87*  --  92* 92*  CO2 23  --  23 27  GLUCOSE 118*  --  108* 105*  BUN 7*  --  6* 6*  CREATININE 0.40*  --  0.44* 0.63  CALCIUM 9.0  --  8.9 9.1  MG  --   1.7  --   --    Liver Function Tests: Recent Labs  Lab 03/30/21 1607 03/31/21 0317  AST 22 20  ALT 18 18  ALKPHOS 59 54  BILITOT 1.5* 1.4*  PROT 6.5 6.2*  ALBUMIN 4.3 3.9   Recent Labs  Lab 03/30/21 1607  LIPASE 25   No results for input(s): AMMONIA in the last 168 hours. CBC: Recent Labs  Lab 03/30/21 1607 03/31/21 0317  WBC 4.7 5.8  HGB 15.3 14.9  HCT 41.3 40.6  MCV 87.3 87.1  PLT 234 224   Cardiac Enzymes: No results for input(s): CKTOTAL, CKMB, CKMBINDEX, TROPONINI in the last 168 hours. BNP: Invalid input(s): POCBNP CBG: No results for input(s): GLUCAP in the last 168 hours. D-Dimer Recent Labs    03/30/21 1608  DDIMER <0.27   Hgb A1c No results for input(s): HGBA1C in the last 72 hours. Lipid Profile No results for input(s): CHOL, HDL, LDLCALC, TRIG, CHOLHDL, LDLDIRECT in the last 72 hours. Thyroid function studies Recent Labs    03/30/21 2131  TSH 4.321   Anemia work up No results for input(s): VITAMINB12, FOLATE, FERRITIN, TIBC, IRON, RETICCTPCT in the last 72 hours. Urinalysis    Component Value Date/Time   COLORURINE YELLOW (A) 03/30/2021 1900   APPEARANCEUR CLEAR (A) 03/30/2021 1900   LABSPEC 1.010 03/30/2021 1900   LABSPEC 1.010 10/15/2020 1020   PHURINE 7.5 03/30/2021 1900   GLUCOSEU NEGATIVE 03/30/2021 1900   HGBUR NEGATIVE 03/30/2021 1900   BILIRUBINUR NEGATIVE 03/30/2021 1900   BILIRUBINUR negative 10/15/2020 1020   BILIRUBINUR n 04/18/2016 1013   KETONESUR NEGATIVE 03/30/2021 1900   PROTEINUR NEGATIVE 03/30/2021 1900   UROBILINOGEN negative 04/18/2016 1013   NITRITE NEGATIVE 03/30/2021 1900   LEUKOCYTESUR NEGATIVE 03/30/2021 1900   Sepsis Labs Invalid input(s): PROCALCITONIN,  WBC,  LACTICIDVEN Microbiology Recent Results (from the past 240 hour(s))  SARS CORONAVIRUS 2 (TAT 6-24 HRS) Nasopharyngeal Nasopharyngeal Swab     Status: Abnormal   Collection Time: 03/30/21  9:50 PM   Specimen: Nasopharyngeal Swab  Result Value  Ref Range Status   SARS Coronavirus 2 POSITIVE (A) NEGATIVE Final    Comment: (NOTE) SARS-CoV-2 target nucleic acids are DETECTED.  The SARS-CoV-2 RNA is generally detectable in upper and lower respiratory specimens during the acute phase of infection. Positive results are indicative of the presence of SARS-CoV-2 RNA. Clinical correlation with patient history and other diagnostic information is  necessary to determine patient infection status. Positive results do not rule out bacterial infection or co-infection with other viruses.  The expected result is Negative.  Fact Sheet for Patients: SugarRoll.be  Fact Sheet for Healthcare Providers: https://www.woods-mathews.com/  This test is not yet approved or cleared by the [place] FDA and  has been authorized for detection and/or diagnosis of SARS-CoV-2 by FDA under an Emergency Use Authorization (EUA). This EUA will remain  in effect (meaning this test can be used) for the duration of the COVID-19 declaration under Section 564(b)(1) of the Act, 21 U. S.C. section 360bbb-3(b)(1), unless the authorization is terminated or revoked sooner.   Performed at [facility], [place] [address], [place], [place] [address]      Time coordinating discharge: Over 30 minutes  SIGNED:   Little Ishikawa, DO [facility] 03/31/2021, 3:44 PM Pager   If 7PM-7AM, please contact night-coverage [email or web address]

## 2021-03-31 NOTE — Progress Notes (Signed)
Pt discharged to home, instructions reviewed pt. Pt acknowledged understanding of instructions. SRP, RN

## 2021-04-01 ENCOUNTER — Telehealth: Payer: Self-pay | Admitting: Family Medicine

## 2021-04-01 NOTE — Telephone Encounter (Signed)
Pt called back and said he is feeling a little better. He is still weak and unable to go to work today. Pt advised to give [place] a call if he has questions or concerns

## 2021-04-01 NOTE — Telephone Encounter (Signed)
Left message for pt. PT tested positive for COVID and went to ER over the weekend. Just wanted to check with pt and see how he is.

## 2021-04-05 ENCOUNTER — Other Ambulatory Visit: Payer: Self-pay

## 2021-04-05 ENCOUNTER — Encounter: Payer: Self-pay | Admitting: Family Medicine

## 2021-04-05 ENCOUNTER — Telehealth (INDEPENDENT_AMBULATORY_CARE_PROVIDER_SITE_OTHER): Payer: BC Managed Care – PPO | Admitting: Family Medicine

## 2021-04-05 VITALS — Temp 96.7°F | Wt 168.0 lb

## 2021-04-05 DIAGNOSIS — R059 Cough, unspecified: Secondary | ICD-10-CM | POA: Diagnosis not present

## 2021-04-05 DIAGNOSIS — U071 COVID-19: Secondary | ICD-10-CM

## 2021-04-05 MED ORDER — BENZONATATE 100 MG PO CAPS: 200.0000 mg | ORAL_CAPSULE | Freq: Three times a day (TID) | ORAL | 0 refills | Status: DC | PRN

## 2021-04-05 MED ORDER — AZITHROMYCIN 500 MG PO TABS: 500.0000 mg | ORAL_TABLET | Freq: Every day | ORAL | 0 refills | Status: DC

## 2021-04-05 NOTE — Progress Notes (Signed)
   Subjective:    Patient ID: Kenneth Romero, male    DOB: 1951-03-06, 70 y.o.   MRN: [id number]  HPI Documentation for virtual audio and video telecommunications through [place] encounter: The patient was located at home. 2 patient identifiers used.  The provider was located in the office. The patient did consent to this visit and is aware of possible charges through their insurance for this visit. The other persons participating in this telemedicine service were none. Time spent on call was 10 minutes and in review of previous records >18 minutes total for counseling and coordination of care. This virtual service is not related to other E/M service within previous 7 days.  He was recently admitted to the hospital for COVID-like symptoms and found to be hyponatremic.  He is doing much better now but in the last day or so has had increased difficulty with a dry hacking cough that is now interfering with sleep.  He states that his sense of smell is slowly coming back.  No shortness of breath or fever.  Review of Systems     Objective:   Physical Exam Alert and in no distress.  Coughing is noted.       Assessment & Plan:  COVID-19  Cough - Plan: benzonatate (TESSALON) 100 MG capsule, azithromycin (ZITHROMAX) 500 MG tablet I think he could be having a secondary bacterial infection and I think azithromycin and Tessalon should be helpful.  He will keep me informed.

## 2021-04-10 ENCOUNTER — Telehealth: Payer: Self-pay | Admitting: Family Medicine

## 2021-04-10 NOTE — Telephone Encounter (Signed)
Pt called and states that he had an episode of tachycardia last night. He did not go to the ER and after a while it stopped. Pt was offered an appt but he wanted to check with JCL first. Please advise pt at [phone].

## 2021-04-17 ENCOUNTER — Telehealth: Payer: Self-pay

## 2021-04-17 ENCOUNTER — Other Ambulatory Visit: Payer: Self-pay | Admitting: Medical

## 2021-04-17 ENCOUNTER — Other Ambulatory Visit: Payer: Self-pay

## 2021-04-17 ENCOUNTER — Encounter: Payer: Self-pay | Admitting: Medical

## 2021-04-17 ENCOUNTER — Ambulatory Visit
Admission: RE | Admit: 2021-04-17 | Discharge: 2021-04-17 | Disposition: A | Discharge status: Home / Self Care | Payer: BC Managed Care – PPO | Source: Ambulatory Visit | Attending: Medical | Admitting: Medical

## 2021-04-17 ENCOUNTER — Ambulatory Visit: Payer: BC Managed Care – PPO | Admitting: Medical

## 2021-04-17 VITALS — BP 120/70 | HR 90 | Temp 97.0°F | Resp 16 | Wt 163.6 lb

## 2021-04-17 DIAGNOSIS — R002 Palpitations: Secondary | ICD-10-CM

## 2021-04-17 DIAGNOSIS — E871 Hypo-osmolality and hyponatremia: Secondary | ICD-10-CM | POA: Diagnosis not present

## 2021-04-17 DIAGNOSIS — Z8616 Personal history of COVID-19: Secondary | ICD-10-CM

## 2021-04-17 DIAGNOSIS — R0602 Shortness of breath: Secondary | ICD-10-CM

## 2021-04-17 LAB — CBC
Hematocrit: 45.5 % (ref 37.5–51.0)
Hemoglobin: 16 g/dL (ref 13.0–17.7)
MCH: 32.4 pg (ref 26.6–33.0)
MCHC: 35.2 g/dL (ref 31.5–35.7)
MCV: 92 fL (ref 79–97)
Platelets: 270 10*3/uL (ref 150–450)
RBC: 4.94 x10E6/uL (ref 4.14–5.80)
RDW: 13.5 % (ref 11.6–15.4)
WBC: 5.5 10*3/uL (ref 3.4–10.8)

## 2021-04-17 LAB — BASIC METABOLIC PANEL
BUN/Creatinine Ratio: 20 (ref 10–24)
BUN: 12 mg/dL (ref 8–27)
CO2: 24 mmol/L (ref 20–29)
Calcium: 9.6 mg/dL (ref 8.6–10.2)
Chloride: 97 mmol/L (ref 96–106)
Creatinine, Ser: 0.61 mg/dL — ABNORMAL LOW (ref 0.76–1.27)
Glucose: 102 mg/dL — ABNORMAL HIGH (ref 70–99)
Potassium: 4.9 mmol/L (ref 3.5–5.2)
Sodium: 131 mmol/L — ABNORMAL LOW (ref 134–144)
eGFR: 102 mL/min/{1.73_m2} (ref >59)

## 2021-04-17 LAB — TROPONIN T: Troponin T (Highly Sensitive): 13 ng/L (ref 0–22)

## 2021-04-17 NOTE — Telephone Encounter (Signed)
Pt. Called stating you told him to call back if he had any more heart palpations and he had it last night and this morning. I did schedule him an apt with Shane at 10:15 but he told me to let you know as well.

## 2021-04-17 NOTE — Progress Notes (Signed)
Subjective:  Kenneth Romero is a 70 y.o. male who presents for Chief Complaint  Patient presents with   Palpitations    Had covid back in September. Having SOB and palpitation. Spoke with Dr. Redmond School last night about it. Rapid heartrate all through the night     Here for palpitations.  For the past few nights been having this symptom.  Feeling the symptoms some at other times, but mostly in early morning hours.  Gets up a few times at night to use rest room.  Around 2 am will noticed irregular beat or fast beats.  Or will get some beats and then a pause.   Thinks this is a lingering effect from covid.   Had covid infections 3 weeks ago, diagnosed 03/24/2021.  When he gets palpitations, if standing feels lightheaded, but no sweats, no nausea.  This morning felt a little SOB, but hasn't been having SOB.    Has been working on slow deep breathing exercises but that doesn't seem to help.  He notes having some palpations a few years ago, had some evaluation at that time.   Hasn't been drinking much caffeine.  No recent alcohol. Normally drinks a few drinks per week of wine or beer.   Never smoker.  Lives alone, no not sure about snoring.   Has some stressors recently with covid, but otherwise no.   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  Appetite hasn't been great.    No NVD, feels like he is drinking a good amount of water.  No other aggravating or relieving factors.    No other c/o.  Past Medical History:  Diagnosis Date   Anal intraepithelial neoplasia I (AIN I)    Vitiligo    Current Outpatient Medications on File Prior to Visit  Medication Sig Dispense Refill   acetaminophen (TYLENOL) 325 MG tablet Take 2 tablets (650 mg total) by mouth every 6 (six) hours as needed for headache. 30 tablet 0   Ascorbic Acid (VITAMIN C) 1000 MG tablet Take 1,000 mg by mouth daily.     aspirin 81 MG tablet Take 81 mg by mouth daily.     fish oil-omega-3 fatty acids 1000 MG capsule Take 2 g by mouth daily.     Garlic  962 MG TABS Take 1 tablet by mouth 1 day or 1 dose.     milk thistle 175 MG tablet Take 175 mg by mouth daily.     Multiple Vitamins-Minerals (MULTIVITAMIN WITH MINERALS) tablet Take 1 tablet by mouth daily.     naproxen sodium (ALEVE) 220 MG tablet Take 220 mg by mouth as needed.     VYZULTA 0.024 % SOLN Apply 1 drop to eye at bedtime.     No current facility-administered medications on file prior to visit.     The following portions of the patient's history were reviewed and updated as appropriate: allergies, current medications, past family history, past medical history, past social history, past surgical history and problem list.  ROS Otherwise as in subjective above  Objective: BP 120/70   Pulse 90   Temp (!) 97 F (36.1 C)   Resp 16   Wt 163 lb 9.6 oz (74.2 kg)   SpO2 98%   BMI 22.19 kg/m   Wt Readings from Last 3 Encounters:  04/17/21 163 lb 9.6 oz (74.2 kg)  04/05/21 168 lb (76.2 kg)  03/30/21 175 lb (79.4 kg)    General appearance: alert, no distress, well developed, well nourished HEENT: normocephalic, sclerae  anicteric, conjunctiva pink and moist, TMs pearly, nares patent, no discharge or erythema, pharynx normal Oral cavity: slightly dry MM, no lesions,  Neck: supple, no lymphadenopathy, no thyromegaly, no masses, no JVD or bruits Heart: RRR, normal S1, S2, no murmurs Lungs: CTA bilaterally, no wheezes, rhonchi, or rales Abdomen: +bs, soft, non tender, non distended, no masses, no hepatomegaly, no splenomegaly Pulses: 2+ radial pulses, 2+ pedal pulses, normal cap refill Ext: no edema Neuro: CN2-12 intact, nonfocal exam    EKG indication, palpitations Rate 80 bpm, PR 152 ms, QRS 72 ms, QTC 389 ms, Axis XX 7 degrees, normal sinus rhythm, possible left enlargement, no acute change  Assessment: Encounter Diagnoses  Name Primary?   Palpitation Yes   Hyponatremia    SOB (shortness of breath)    History of COVID-19      Plan: We discussed current  symptoms and concerns , possible causes.  Interestingly he was hospitalized March 29, 2021 for abdominal discomfort, nausea, vomiting with profound hyponatremia.  He is also had a recent COVID infection.  His symptoms are worse at night.  This could raise concern for sleep apnea or arrhythmia in general.  We will recheck electrolytes and labs today given the recent severe hyponatremia  Advised to increase water intake.  We discussed that he may end up needing to see cardiology for palpitation evaluation in general particular in light of recent COVID infection.  If worse symptoms in the meantime, call 911 or go to emergency department.  Otherwise await labs  Jearl was seen today for palpitations.  Diagnoses and all orders for this visit:  Palpitation -     Basic metabolic panel -     CBC -     Troponin T, STAT ([facility]) -     EKG 12-Lead -     DG Chest 2 View; Future  Hyponatremia -     Basic metabolic panel -     CBC -     Troponin T, STAT ([facility])  SOB (shortness of breath) -     Basic metabolic panel -     CBC -     Troponin T, STAT ([facility]) -     EKG 12-Lead -     DG Chest 2 View; Future  History of COVID-19 -     Basic metabolic panel -     CBC -     Troponin T, STAT ([facility])   Follow up: pending labs

## 2021-04-22 ENCOUNTER — Encounter (HOSPITAL_COMMUNITY): Payer: Self-pay

## 2021-04-22 ENCOUNTER — Telehealth: Payer: Self-pay | Admitting: Family Medicine

## 2021-04-22 ENCOUNTER — Emergency Department (HOSPITAL_COMMUNITY)
Admission: EM | Admit: 2021-04-22 | Discharge: 2021-04-22 | Disposition: A | Discharge status: Home / Self Care | Payer: BC Managed Care – PPO | Attending: Emergency Medicine | Admitting: Emergency Medicine

## 2021-04-22 ENCOUNTER — Emergency Department (HOSPITAL_COMMUNITY): Payer: BC Managed Care – PPO

## 2021-04-22 DIAGNOSIS — Z8616 Personal history of COVID-19: Secondary | ICD-10-CM | POA: Diagnosis not present

## 2021-04-22 DIAGNOSIS — I4891 Unspecified atrial fibrillation: Secondary | ICD-10-CM | POA: Insufficient documentation

## 2021-04-22 DIAGNOSIS — Z79899 Other long term (current) drug therapy: Secondary | ICD-10-CM | POA: Diagnosis not present

## 2021-04-22 DIAGNOSIS — Z7901 Long term (current) use of anticoagulants: Secondary | ICD-10-CM | POA: Insufficient documentation

## 2021-04-22 DIAGNOSIS — Z7982 Long term (current) use of aspirin: Secondary | ICD-10-CM | POA: Insufficient documentation

## 2021-04-22 DIAGNOSIS — R0602 Shortness of breath: Secondary | ICD-10-CM | POA: Diagnosis present

## 2021-04-22 DIAGNOSIS — Z87891 Personal history of nicotine dependence: Secondary | ICD-10-CM | POA: Insufficient documentation

## 2021-04-22 LAB — COMPREHENSIVE METABOLIC PANEL
ALT: 21 U/L (ref 0–44)
AST: 26 U/L (ref 15–41)
Albumin: 4.5 g/dL (ref 3.5–5.0)
Alkaline Phosphatase: 60 U/L (ref 38–126)
Anion gap: 10 (ref 5–15)
BUN: 10 mg/dL (ref 8–23)
CO2: 21 mmol/L — ABNORMAL LOW (ref 22–32)
Calcium: 9.2 mg/dL (ref 8.9–10.3)
Chloride: 99 mmol/L (ref 98–111)
Creatinine, Ser: 0.55 mg/dL — ABNORMAL LOW (ref 0.61–1.24)
GFR, Estimated: >60 mL/min (ref >60)
Glucose, Bld: 132 mg/dL — ABNORMAL HIGH (ref 70–99)
Potassium: 4.2 mmol/L (ref 3.5–5.1)
Sodium: 130 mmol/L — ABNORMAL LOW (ref 135–145)
Total Bilirubin: 1.2 mg/dL (ref 0.3–1.2)
Total Protein: 6.4 g/dL — ABNORMAL LOW (ref 6.5–8.1)

## 2021-04-22 LAB — CBC WITH DIFFERENTIAL/PLATELET
Abs Immature Granulocytes: 0.01 10*3/uL (ref 0.00–0.07)
Basophils Absolute: 0 10*3/uL (ref 0.0–0.1)
Basophils Relative: 0 %
Eosinophils Absolute: 0 10*3/uL (ref 0.0–0.5)
Eosinophils Relative: 1 %
HCT: 43 % (ref 39.0–52.0)
Hemoglobin: 15.2 g/dL (ref 13.0–17.0)
Immature Granulocytes: 0 %
Lymphocytes Relative: 20 %
Lymphs Abs: 1.2 10*3/uL (ref 0.7–4.0)
MCH: 32.2 pg (ref 26.0–34.0)
MCHC: 35.3 g/dL (ref 30.0–36.0)
MCV: 91.1 fL (ref 80.0–100.0)
Monocytes Absolute: 0.5 10*3/uL (ref 0.1–1.0)
Monocytes Relative: 8 %
Neutro Abs: 4.1 10*3/uL (ref 1.7–7.7)
Neutrophils Relative %: 71 %
Platelets: 220 10*3/uL (ref 150–400)
RBC: 4.72 MIL/uL (ref 4.22–5.81)
RDW: 12.4 % (ref 11.5–15.5)
WBC: 5.8 10*3/uL (ref 4.0–10.5)
nRBC: 0 % (ref 0.0–0.2)

## 2021-04-22 MED ORDER — APIXABAN 5 MG PO TABS: 5.0000 mg | ORAL_TABLET | Freq: Two times a day (BID) | ORAL | 0 refills | Status: DC

## 2021-04-22 MED ORDER — METOPROLOL SUCCINATE ER 25 MG PO TB24: 25.0000 mg | ORAL_TABLET | Freq: Every day | ORAL | 0 refills | Status: DC

## 2021-04-22 MED ORDER — APIXABAN (ELIQUIS) EDUCATION KIT FOR DVT/PE PATIENTS
PACK | Freq: Once | Status: AC
  Filled 2021-04-22: qty 1

## 2021-04-22 MED ORDER — SODIUM CHLORIDE 0.9 % IV BOLUS
1000.0000 mL | Freq: Once | INTRAVENOUS | Status: AC
  Administered 2021-04-22: 1000 mL via INTRAVENOUS

## 2021-04-22 MED ORDER — APIXABAN 5 MG PO TABS
5.0000 mg | ORAL_TABLET | Freq: Two times a day (BID) | ORAL | Status: DC
  Administered 2021-04-22: 5 mg via ORAL
  Filled 2021-04-22: qty 1

## 2021-04-22 MED ORDER — METOPROLOL TARTRATE 5 MG/5ML IV SOLN
10.0000 mg | Freq: Once | INTRAVENOUS | Status: AC
  Administered 2021-04-22: 10 mg via INTRAVENOUS
  Filled 2021-04-22: qty 10

## 2021-04-22 MED ORDER — METOPROLOL TARTRATE 25 MG PO TABS
25.0000 mg | ORAL_TABLET | Freq: Once | ORAL | Status: AC
  Administered 2021-04-22: 25 mg via ORAL
  Filled 2021-04-22: qty 1

## 2021-04-22 NOTE — Telephone Encounter (Signed)
Pt called and states that he has been in afib all night long. Pt states he saw Audelia Acton was was given a referral to Cardio. JCL was asked what pt needed to do and he advised pt to call Cardio to get in earlier. Per pt tomorrow was earliest pt can be seen. JCL was advised and JCL states if pt is having difficulty then he needs to go to ER. PT was advised. PT states he will go to ER.

## 2021-04-22 NOTE — ED Notes (Signed)
Pt brought to room 2 untriaged,  provider and NT at bedside.

## 2021-04-22 NOTE — Discharge Instructions (Addendum)

## 2021-04-22 NOTE — ED Provider Notes (Signed)
[facility] DEPT Provider Note   CSN: [id number] Arrival date & time: 04/22/21  8891     History No chief complaint on file.   Kenneth Romero is a 70 y.o. male.  70 yo M with a chief complaints of an irregular heartbeat.  This been off and on for about a month or 2 now.  Patient was diagnosed with COVID at the onset and has been having the symptoms mostly at night.  Usually resolve spontaneously.  He saw his family doctor's office this week and was evaluated for the same.  At that time was not having symptoms had blood work and was referred to cardiology.  His appointment for that is tomorrow.  Last night his symptoms were much more persistent and he called his family doctor this morning who suggested he come to the ED for evaluation.  He has no diagnosis of atrial fibrillation.  Not on anticoagulation.  Has a little bit of a persistent cough from COVID but nothing that he is concerned about.  When his heart rate is fast he feels some chest pressure and shortness of breath.  Feels a bit lightheaded when he stands up to try and ambulating.  He denies any decreased oral intake denies any recent medication changes.  The history is provided by the patient.  Illness Severity:  Moderate Onset quality:  Gradual Duration:  1 month Timing:  Intermittent Progression:  Waxing and waning Chronicity:  New Associated symptoms: shortness of breath   Associated symptoms: no abdominal pain, no chest pain, no congestion, no diarrhea, no fever, no headaches, no myalgias, no rash and no vomiting       Past Medical History:  Diagnosis Date   Anal intraepithelial neoplasia I (AIN I)    Vitiligo     Patient Active Problem List   Diagnosis Date Noted   COVID-19 virus infection 03/30/2021   Hyponatremia 03/30/2021   Glaucoma 11/04/2013   Anal intraepithelial neoplasia I (AIN I) 11/25/2010    Past Surgical History:  Procedure Laterality Date   ANUS SURGERY      COLONOSCOPY  07/07/2004   DR.HUNG   SIGMOIDOSCOPY  07/07/2008   DR.HUNG       No family history on file.  Social History   Tobacco Use   Smoking status: Former    Types: Cigarettes    Quit date: 07/07/1972    Years since quitting: 48.8   Smokeless tobacco: Never  Vaping Use   Vaping Use: Never used  Substance Use Topics   Alcohol use: Yes    Alcohol/week: 3.0 standard drinks    Types: 3 Glasses of wine per week   Drug use: No    Home Medications Prior to Admission medications   Medication Sig Start Date End Date Taking? Authorizing Provider  acetaminophen (TYLENOL) 325 MG tablet Take 2 tablets (650 mg total) by mouth every 6 (six) hours as needed for headache. 03/31/21  Yes Little Ishikawa, MD  apixaban (ELIQUIS) 5 MG TABS tablet Take 1 tablet (5 mg total) by mouth 2 (two) times daily. 04/22/21 05/22/21 Yes Deno Etienne, DO  Ascorbic Acid (VITAMIN C) 1000 MG tablet Take 1,000 mg by mouth 3 (three) times daily.   Yes [provider]  aspirin 81 MG tablet Take 81 mg by mouth daily.   Yes [provider]  benzonatate (TESSALON) 100 MG capsule Take 100 mg by mouth 3 (three) times daily as needed for cough.   Yes [provider]  fish oil-omega-3 fatty acids 1000 MG capsule Take 1 g by mouth daily.   Yes [provider]  Garlic 568 MG TABS Take 1 tablet by mouth 1 day or 1 dose.   Yes [provider]  metoprolol succinate (TOPROL-XL) 25 MG 24 hr tablet Take 1 tablet (25 mg total) by mouth daily. 04/22/21  Yes Deno Etienne, DO  milk thistle 175 MG tablet Take 175 mg by mouth daily.   Yes [provider]  Multiple Vitamins-Minerals (MULTIVITAMIN WITH MINERALS) tablet Take 1 tablet by mouth daily.   Yes [provider]  naproxen sodium (ALEVE) 220 MG tablet Take 220 mg by mouth daily as needed (pain).   Yes [provider]  VYZULTA 0.024 % SOLN Place 1 drop into both eyes at bedtime. 08/22/19  Yes [provider]    Allergies    Sulfa antibiotics  Review of Systems   Review of Systems  Constitutional:  Negative for chills and fever.  HENT:  Negative for congestion and facial swelling.   Eyes:  Negative for discharge and visual disturbance.  Respiratory:  Positive for chest tightness and shortness of breath.   Cardiovascular:  Positive for palpitations. Negative for chest pain.  Gastrointestinal:  Negative for abdominal pain, diarrhea and vomiting.  Musculoskeletal:  Negative for arthralgias and myalgias.  Skin:  Negative for color change and rash.  Neurological:  Negative for tremors, syncope and headaches.  Psychiatric/Behavioral:  Negative for confusion and dysphoric mood.    Physical Exam Updated Vital Signs BP 111/83   Pulse 81   Temp 98.5 F (36.9 C) (Oral)   Resp (!) 23   SpO2 97%   Physical Exam Vitals and nursing note reviewed.  Constitutional:      Appearance: He is well-developed.  HENT:     Head: Normocephalic and atraumatic.  Eyes:     Pupils: Pupils are equal, round, and reactive to light.  Neck:     Vascular: No JVD.  Cardiovascular:     Rate and Rhythm: Tachycardia present. Rhythm irregular.     Heart sounds: No murmur heard.   No friction rub. No gallop.  Pulmonary:     Effort: No respiratory distress.     Breath sounds: No wheezing.  Abdominal:     General: There is no distension.     Tenderness: There is no abdominal tenderness. There is no guarding or rebound.  Musculoskeletal:        General: Normal range of motion.     Cervical back: Normal range of motion and neck supple.  Skin:    Coloration: Skin is not pale.     Findings: No rash.  Neurological:     Mental Status: He is alert and oriented to person, place, and time.  Psychiatric:        Behavior: Behavior normal.    ED Results / Procedures / Treatments   Labs (all labs ordered are listed, but only abnormal results are displayed) Labs Reviewed  COMPREHENSIVE METABOLIC PANEL  - Abnormal; Notable for the following components:      Result Value   Sodium 130 (*)    CO2 21 (*)    Glucose, Bld 132 (*)    Creatinine, Ser 0.55 (*)    Total Protein 6.4 (*)    All other components within normal limits  CBC WITH DIFFERENTIAL/PLATELET    EKG None  Radiology DG Chest Port 1 View  Result Date: 04/22/2021 CLINICAL DATA:  AFib EXAM: PORTABLE CHEST  1 VIEW COMPARISON:  04/17/2021 FINDINGS: The heart size and mediastinal contours are within normal limits. Both lungs are clear. The visualized skeletal structures are unremarkable. IMPRESSION: No acute abnormality of the lungs in AP portable projection Electronically Signed   By: Delanna Ahmadi M.D.   On: 04/22/2021 10:08    Procedures Procedures   Medications Ordered in ED Medications  apixaban [facility]) Education Kit for DVT/PE patients (has no administration in time range)  apixaban (ELIQUIS) tablet 5 mg (has no administration in time range)  sodium chloride 0.9 % bolus 1,000 mL (1,000 mLs Intravenous New Bag/Given 04/22/21 0925)  metoprolol tartrate (LOPRESSOR) injection 10 mg (10 mg Intravenous Given 04/22/21 0925)  metoprolol tartrate (LOPRESSOR) tablet 25 mg (25 mg Oral Given 04/22/21 1001)    ED Course  I have reviewed the triage vital signs and the nursing notes.  Pertinent labs & imaging results that were available during my care of the patient were reviewed by me and considered in my medical decision making (see chart for details).    MDM Rules/Calculators/A&P                           70 yo M with a chief complaints of intermittent palpitations.  Going off and on since he had COVID.  Usually occurs at night and then resolves however had symptoms last night that did not resolve.  In A. fib with RVR with rates into the 150s here.  We will give a bolus of IV fluids check blood work dose of metoprolol reassess.  HR improved.  Feeling mildly better.  Blood work is unremarkable.  Had a TSH done in the office a  week ago.  Will start on Eliquis.  Started on metoprolol.  Cardiology follow-up.  Has follow-up tomorrow.  CRITICAL CARE Performed by: Cecilio Asper   Total critical care time: 35 minutes  Critical care time was exclusive of separately billable procedures and treating other patients.  Critical care was necessary to treat or prevent imminent or life-threatening deterioration.  Critical care was time spent personally by me on the following activities: development of treatment plan with patient and/or surrogate as well as nursing, discussions with consultants, evaluation of patient's response to treatment, examination of patient, obtaining history from patient or surrogate, ordering and performing treatments and interventions, ordering and review of laboratory studies, ordering and review of radiographic studies, pulse oximetry and re-evaluation of patient's condition.  CHA2DS2/VAS Stroke Risk Points      N/A >= 2 Points: High Risk  1 - 1.99 Points: Medium Risk  0 Points: Low Risk    Last Change: N/A      This score determines the patient's risk of having a stroke if the  patient has atrial fibrillation.      This score is not applicable to this patient. Components are not  calculated.      11:28 AM:  I have discussed the diagnosis/risks/treatment options with the patient and believe the pt to be eligible for discharge home to follow-up with Cards. We also discussed returning to the ED immediately if new or worsening sx occur. We discussed the sx which are most concerning (e.g., sudden worsening pain, fever, inability to tolerate by mouth) that necessitate immediate return. Medications administered to the patient during their visit and any new prescriptions provided to the patient are listed below.  Medications given during this visit Medications  apixaban [facility]) Education Kit for DVT/PE patients (has no  administration in time range)  apixaban (ELIQUIS) tablet 5 mg (has no  administration in time range)  sodium chloride 0.9 % bolus 1,000 mL (1,000 mLs Intravenous New Bag/Given 04/22/21 0925)  metoprolol tartrate (LOPRESSOR) injection 10 mg (10 mg Intravenous Given 04/22/21 0925)  metoprolol tartrate (LOPRESSOR) tablet 25 mg (25 mg Oral Given 04/22/21 1001)     The patient appears reasonably screen and/or stabilized for discharge and I doubt any other medical condition or other [facility] requiring further screening, evaluation, or treatment in the ED at this time prior to discharge.    Final Clinical Impression(s) / ED Diagnoses Final diagnoses:  Atrial fibrillation with rapid ventricular response ([place])    Rx / DC Orders ED Discharge Orders          Ordered    apixaban (ELIQUIS) 5 MG TABS tablet  2 times daily        04/22/21 1124    metoprolol succinate (TOPROL-XL) 25 MG 24 hr tablet  Daily        04/22/21 [place], Frankey Botting, DO 04/22/21 1128

## 2021-04-22 NOTE — ED Notes (Signed)
Pharmacy at bedside

## 2021-04-22 NOTE — Progress Notes (Signed)
ANTICOAGULATION CONSULT NOTE - Initial Consult  Pharmacy Consult for apixaban Indication: atrial fibrillation  Allergies  Allergen Reactions   Sulfa Antibiotics Other (See Comments)    Unknown (childhood)    Patient Measurements:   Heparin Dosing Weight:   Vital Signs: Temp: 98.5 F (36.9 C) (10/17 0928) Temp Source: Oral (10/17 0928) BP: 115/85 (10/17 1000) Pulse Rate: 72 (10/17 1015)  Labs: Recent Labs    04/22/21 0919  HGB 15.2  HCT 43.0  PLT 220  CREATININE 0.55*    Estimated Creatinine Clearance: 87.6 mL/min (A) (by C-G formula based on SCr of 0.55 mg/dL (L)).   Medical History: Past Medical History:  Diagnosis Date   Anal intraepithelial neoplasia I (AIN I)    Vitiligo      Assessment: 70 yo M with new dx of Afib. Pharmacy consulted for apixaban.   SCr 0.55, CBC WNL.  Age <80, Wt > 60. SCr < 1.5    Plan:  Apixaban 5 mg po bid Will provide 30 day free card & education prior to discharge  Eudelia Bunch, Pharm.D 04/22/2021 10:31 AM

## 2021-04-22 NOTE — ED Triage Notes (Signed)
Pt complaining of irregular heartbeat.  Pt had covid, skin warm and dry provider immediately at bedside. Pt complaining of symptoms over the past few weeks coming and going.

## 2021-04-23 ENCOUNTER — Ambulatory Visit (INDEPENDENT_AMBULATORY_CARE_PROVIDER_SITE_OTHER): Payer: BC Managed Care – PPO

## 2021-04-23 ENCOUNTER — Encounter: Payer: Self-pay | Admitting: Internal Medicine

## 2021-04-23 ENCOUNTER — Other Ambulatory Visit: Payer: Self-pay

## 2021-04-23 ENCOUNTER — Ambulatory Visit: Payer: BC Managed Care – PPO | Admitting: Internal Medicine

## 2021-04-23 VITALS — BP 110/70 | HR 70 | Ht 72.0 in | Wt 168.4 lb

## 2021-04-23 DIAGNOSIS — I4891 Unspecified atrial fibrillation: Secondary | ICD-10-CM

## 2021-04-23 NOTE — Addendum Note (Signed)
Addended by: Meryl Crutch on: 04/23/2021 01:59 PM   Modules accepted: Orders

## 2021-04-23 NOTE — Progress Notes (Signed)
Cardiology Office Note:    Date:  04/23/2021   ID:  Kenneth Romero, DOB 1951/02/15, MRN [id number]  PCP:  Denita Lung, MD   [facility] Providers Cardiologist:  None     Referring MD: Carlena Hurl, PA-C   No chief complaint on file. Palpitations  History of Present Illness:    Kenneth Romero is a 70 y.o. male with a hx of COVID19,  anal intraepithelial neoplasm, Glaucoma,seen in the ED for irregular heart rhythm, COVID+ referral for c/f atrial fibrillation  Patient recently had COVID, saw his primary provider and noted some irregular heart beats. He was started on metoprolol 25 mg daily and eliquis. No EKG has shown atrial fibrillation. No history of bleeding. He noted that afib was seen on tele. Not captured on 12 leads or media strips I've seen. He notes for 30 seconds for a minute he notes palpitations. Once he got COVID , palpitations were happening at night. The palpitations kept him from sleeping prior to going to the ED. He then went to the ER with concern. It lasted all night. IV metop push was given. His troponin was negative. He has no hx of CVD. Normal renal function. He denies angina,  some dyspnea on exertion, lower extremity edema, PND or orthopnea.  He does tire with stairs lately. No hx of OSA. TSH 4.3  Age 10 No hypertension No DMII No stroke hx No MI, No PAD No heart failure  Social hx: Smoked when he was young stopped at 81. He is a Financial controller at [employer]. Very healthy, good diet, exercise and yoga.  Past Medical History:  Diagnosis Date   Anal intraepithelial neoplasia I (AIN I)    Vitiligo     Past Surgical History:  Procedure Laterality Date   ANUS SURGERY     COLONOSCOPY  07/07/2004   DR.HUNG   SIGMOIDOSCOPY  07/07/2008   DR.HUNG    Current Medications: No outpatient medications have been marked as taking for the 04/23/21 encounter (Appointment) with Janina Mayo, MD.     Allergies:   Sulfa antibiotics   Social History    Socioeconomic History   Marital status: Single    Spouse name: Not on file   Number of children: Not on file   Years of education: Not on file   Highest education level: Not on file  Occupational History   Not on file  Tobacco Use   Smoking status: Former    Types: Cigarettes    Quit date: 07/07/1972    Years since quitting: 48.8   Smokeless tobacco: Never  Vaping Use   Vaping Use: Never used  Substance and Sexual Activity   Alcohol use: Yes    Alcohol/week: 3.0 standard drinks    Types: 3 Glasses of wine per week   Drug use: No   Sexual activity: Not Currently  Other Topics Concern   Not on file  Social History Narrative   Not on file   Social Determinants of Health   Financial Resource Strain: Not on file  Food Insecurity: Not on file  Transportation Needs: Not on file  Physical Activity: Not on file  Stress: Not on file  Social Connections: Not on file     Family History: The patient's father pulmonary fibrosis- deceased. Mother- psychiatric disorders  ROS:   Please see the history of present illness.     All other systems reviewed and are negative.  EKGs/Labs/Other Studies Reviewed:    The following  studies were reviewed today:   EKG:  EKG is  ordered today.  The ekg ordered today demonstrates   04/17/2021-NSR  Recent Labs: 03/30/2021: Magnesium 1.7; TSH 4.321 04/22/2021: ALT 21; BUN 10; Creatinine, Ser 0.55; Hemoglobin 15.2; Platelets 220; Potassium 4.2; Sodium 130  Recent Lipid Panel    Component Value Date/Time   CHOL 179 10/15/2020 0934   TRIG 67 10/15/2020 0934   HDL 66 10/15/2020 0934   CHOLHDL 2.7 10/15/2020 0934   CHOLHDL 3.0 12/26/2014 0001   VLDL 19 12/26/2014 0001   LDLCALC 100 (H) 10/15/2020 0934     Risk Assessment/Calculations:    CHA2DS2-VASc Score =     This indicates a  % annual risk of stroke. The patient's score is based upon:     CHADS2VASC = 1       Physical Exam:    VS:  There were no vitals taken for this  visit.    Wt Readings from Last 3 Encounters:  04/17/21 163 lb 9.6 oz (74.2 kg)  04/05/21 168 lb (76.2 kg)  03/30/21 175 lb (79.4 kg)     GEN:  Well nourished, well developed in no acute distress HEENT: Normal NECK: No JVD; No carotid bruits CARDIAC: RRR, no murmurs, rubs, gallops Vasc: 2+ BL radial RESPIRATORY:  Clear to auscultation without rales, wheezing or rhonchi  ABDOMEN: Soft, non-tender, non-distended MUSCULOSKELETAL:  No edema; No deformity  SKIN: Warm and dry NEUROLOGIC:  Alert and oriented x 3 PSYCHIATRIC:  Normal affect   ASSESSMENT:    #Paroxysmal Atrial Fibrillation: Chads2vasc = 1 (Age) In the ED it was noted that he developed atrial fibrillation. He was given IV metop per patient. Will obtain 14 day ziopatch and assess risk with TTE. We discussed that if he is consistently going in between sinus and atrial fibrillation we can consider continuing eliquis. If his burden is very low will discuss just ASA. He would prefer to not have to continuing a medication unless necessary.  - continue metoprolol 25 mg daily - continue eliquis 5 mg BID  PLAN:    In order of problems listed above:  Echocardiogram complete Cardiac event monitor 14 days Follow up 6 months     Medication Adjustments/Labs and Tests Ordered: Current medicines are reviewed at length with the patient today.  Concerns regarding medicines are outlined above.    Signed, Janina Mayo, MD  04/23/2021 8:42 AM    [facility]

## 2021-04-23 NOTE — Patient Instructions (Addendum)
Medication Instructions:  Your Physician recommend you continue on your current medication as directed.    *If you need a refill on your cardiac medications before your next appointment, please call your pharmacy*   Lab Work: None ordered today   Testing/Procedures: Your physician has requested that you have an echocardiogram. Echocardiography is a painless test that uses sound waves to create images of your heart. It provides your doctor with information about the size and shape of your heart and how well your heart's chambers and valves are working. This procedure takes approximately one hour. There are no restrictions for this procedure. [place] [address]  Our physician has recommended that you wear an 14  DAY ZIO-PATCH monitor. The Zio patch cardiac monitor continuously records heart rhythm data for up to 14 days, this is for patients being evaluated for multiple types heart rhythms. For the first 24 hours post application, please avoid getting the Zio monitor wet in the shower or by excessive sweating during exercise. After that, feel free to carry on with regular activities. Keep soaps and lotions away from the ZIO XT Patch.   Someone from our office will call to verify address and mail monitor.     Follow-Up: At [facility], you and your health needs are our priority.  As part of our continuing mission to provide you with exceptional heart care, we have created designated Provider Care Teams.  These Care Teams include your primary Cardiologist (physician) and Advanced Practice Providers (APPs -  Physician Assistants and Nurse Practitioners) who all work together to provide you with the care you need, when you need it.  We recommend signing up for the patient portal called "MyChart".  Sign up information is provided on this After Visit Summary.  MyChart is used to connect with patients for Virtual Visits (Telemedicine).  Patients are able to view lab/test results,  encounter notes, upcoming appointments, etc.  Non-urgent messages can be sent to your provider as well.   To learn more about what you can do with MyChart, go to [email or web address].    Your next appointment:   6 month(s)  The format for your next appointment:   In Person  Provider:   Phineas Inches, MD  [place] Monitor Instructions  Your physician has requested you wear a ZIO patch monitor for 14 days.  This is a single patch monitor. [employer] supplies one patch monitor per enrollment. Additional stickers are not available. Please do not apply patch if you will be having a Nuclear Stress Test,  Echocardiogram, Cardiac CT, MRI, or Chest Xray during the period you would be wearing the  monitor. The patch cannot be worn during these tests. You cannot remove and re-apply the  ZIO XT patch monitor.  Your ZIO patch monitor will be mailed 3 day USPS to your address on file. It may take 3-5 days  to receive your monitor after you have been enrolled.  Once you have received your monitor, please review the enclosed instructions. Your monitor  has already been registered assigning a specific monitor serial # to you.  Billing and Patient Assistance Program Information  We have supplied [employer] with any of your insurance information on file for billing purposes. [employer] offers a sliding scale Patient Assistance Program for patients that do not have  insurance, or whose insurance does not completely cover the cost of the ZIO monitor.  You must apply for the Patient Assistance Program to qualify for this discounted rate.  To apply, please call [employer] at [phone], select option 4, select option 2, ask to apply for  Patient Assistance Program. Theodore Demark will ask your household income, and how many people  are in your household. They will quote your out-of-pocket cost based on that information.  [employer] will also be able to set up a 26-month, interest-free payment plan if  needed.  Applying the monitor   Shave hair from upper left chest.  Hold abrader disc by orange tab. Rub abrader in 40 strokes over the upper left chest as  indicated in your monitor instructions.  Clean area with 4 enclosed alcohol pads. Let dry.  Apply patch as indicated in monitor instructions. Patch will be placed under collarbone on left  side of chest with arrow pointing upward.  Rub patch adhesive wings for 2 minutes. Remove white label marked "1". Remove the white  label marked "2". Rub patch adhesive wings for 2 additional minutes.  While looking in a mirror, press and release button in center of patch. A small green light will  flash 3-4 times. This will be your only indicator that the monitor has been turned on.  Do not shower for the first 24 hours. You may shower after the first 24 hours.  Press the button if you feel a symptom. You will hear a small click. Record Date, Time and  Symptom in the Patient Logbook.  When you are ready to remove the patch, follow instructions on the last 2 pages of Patient  Logbook. Stick patch monitor onto the last page of Patient Logbook.  Place Patient Logbook in the blue and white box. Use locking tab on box and tape box closed  securely. The blue and white box has prepaid postage on it. Please place it in the mailbox as  soon as possible. Your physician should have your test results approximately 7 days after the  monitor has been mailed back to [facility].  Call [facility] at [phone] if you have questions regarding  your ZIO XT patch monitor. Call them immediately if you see an orange light blinking on your  monitor.  If your monitor falls off in less than 4 days, contact our Monitor department at [phone].  If your monitor becomes loose or falls off after 4 days call [employer] at [phone] for  suggestions on securing your monitor

## 2021-04-23 NOTE — Progress Notes (Unsigned)
Patient enrolled for [employer] to mail a 14 day ZIO XT monitor to his address on file.

## 2021-04-26 DIAGNOSIS — I4891 Unspecified atrial fibrillation: Secondary | ICD-10-CM

## 2021-05-10 ENCOUNTER — Other Ambulatory Visit: Payer: Self-pay

## 2021-05-10 ENCOUNTER — Ambulatory Visit (HOSPITAL_COMMUNITY): Payer: BC Managed Care – PPO | Attending: Internal Medicine

## 2021-05-10 DIAGNOSIS — I4891 Unspecified atrial fibrillation: Secondary | ICD-10-CM | POA: Diagnosis not present

## 2021-05-10 LAB — ECHOCARDIOGRAM COMPLETE
Area-P 1/2: 3.27 cm2
S' Lateral: 2.2 cm

## 2021-05-17 ENCOUNTER — Telehealth: Payer: Self-pay | Admitting: Internal Medicine

## 2021-05-17 ENCOUNTER — Other Ambulatory Visit: Payer: Self-pay | Admitting: Internal Medicine

## 2021-05-17 MED ORDER — APIXABAN 5 MG PO TABS: 5.0000 mg | ORAL_TABLET | Freq: Two times a day (BID) | ORAL | 6 refills | Status: DC

## 2021-05-17 MED ORDER — METOPROLOL SUCCINATE ER 25 MG PO TB24: 25.0000 mg | ORAL_TABLET | Freq: Every day | ORAL | 2 refills | Status: DC

## 2021-05-17 NOTE — Telephone Encounter (Signed)
Spoke to patient . Patient states he only g=has a few days of medications . Received a 30 day supply,  Patient question if there was another option for Eliquis .It cost almost $20 dollars a pill a day.  Patient states he did received onetime 16 da y free medication.   RN informed the patient . There are options but the first thing he will have to contact his  insurance - to see what was on his medication formulary-which was is  less costly. Dr Harl Bowie  could make a decision  on which medication would be more appropriate for patient.  Patient wanted to know if medication prior to office visit. RN could not give an answer to the question it will be up to Dr Harl Bowie.  Patient verbalized understanding. Patient he will call back with the information.

## 2021-05-17 NOTE — Telephone Encounter (Signed)
Pt c/o medication issue:  1. Name of Medications:  apixaban (ELIQUIS) 5 MG TABS tablet metoprolol succinate (TOPROL-XL) 25 MG 24 hr tablet  2. How are you currently taking this medication (dosage and times per day)? As directed  3. Are you having a reaction (difficulty breathing--STAT)?   4. What is your medication issue?  Patient wants to know if he needs to continue taking both of these medications. HE was only written a 30 day supply while he was in the hospital and is close to running out of medication.  If the office wants him to continue with the medications, please send new rx to [facility] [place] [facility] [place] [facility] [address]  Patient is scheduled to see Dr. Harl Bowie 05/27/21 but is not sure he wants to wait that long to get an answer

## 2021-05-17 NOTE — Telephone Encounter (Signed)
Called Kenneth Romero about his eliquis. With CHADS2VASC of 1, aspirin only is reasonable. We discussed stopping eliquis and starting aspirin.

## 2021-05-27 ENCOUNTER — Encounter: Payer: Self-pay | Admitting: Internal Medicine

## 2021-05-27 ENCOUNTER — Other Ambulatory Visit: Payer: Self-pay

## 2021-05-27 ENCOUNTER — Ambulatory Visit: Payer: BC Managed Care – PPO | Admitting: Internal Medicine

## 2021-05-27 VITALS — BP 130/82 | HR 53 | Ht 72.0 in | Wt 167.7 lb

## 2021-05-27 DIAGNOSIS — I4891 Unspecified atrial fibrillation: Secondary | ICD-10-CM

## 2021-05-27 NOTE — Progress Notes (Signed)
Cardiology Office Note:    Date:  05/27/2021   ID:  Kenneth Romero, DOB 04/20/1951, MRN [id number]  PCP:  Denita Lung, MD   [facility] Providers Cardiologist:  None     Referring MD: Denita Lung, MD   No chief complaint on file. Atrial Fibrillation  History of Present Illness:    Kenneth Romero is a 70 y.o. male with a hx of COVID19,  anal intraepithelial neoplasm, Glaucoma,seen in the ED for irregular heart rhythm, COVID+, initial  referral for c/f atrial fibrillation  Patient recently had COVID, saw his primary provider and noted some irregular heart beats. He was started on metoprolol 25 mg daily and eliquis. No EKG has shown atrial fibrillation. No history of bleeding. He noted that afib was seen on tele. Not captured on 12 leads or media strips I've seen. He notes for 30 seconds for a minute he notes palpitations. Once he got COVID , palpitations were happening at night. The palpitations kept him from sleeping prior to going to the ED. He then went to the ER with concern. It lasted all night. IV metop push was given. His troponin was negative. He has no hx of CVD. Normal renal function. He denies angina,  some dyspnea on exertion, lower extremity edema, PND or orthopnea.  He does tire with stairs lately. No hx of OSA. TSH 4.3  Age 9 No hypertension No DMII No stroke hx No MI, No PAD No heart failure  Social hx: Smoked when he was young stopped at 47. He is a Financial controller at [employer]. Very healthy, good diet, exercise and yoga.  Interim 05/27/2021: Kenneth Romero has since stopped eliquis after 30 days as a CHADSCVASC of 1. He is taking ASA 81 mg daily. Today he states he is doing well. Blood pressures are in good control. Has no signs of sleep apnea. He is able to do his daily activities. He feels much better since COVID. 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   Cardiology Studies 04/23/2021- Predominantly NSR. 8% Atrial fibrillation  burden.  05/10/2021- Normal LV/RV function. Mild AI. No signiificant valve dx or pulm htn   Past Medical History:  Diagnosis Date   Anal intraepithelial neoplasia I (AIN I)    Vitiligo     Past Surgical History:  Procedure Laterality Date   ANUS SURGERY     COLONOSCOPY  07/07/2004   DR.HUNG   SIGMOIDOSCOPY  07/07/2008   DR.HUNG    Current Medications: No outpatient medications have been marked as taking for the 05/27/21 encounter (Appointment) with Janina Mayo, MD.     Allergies:   Sulfa antibiotics   Social History   Socioeconomic History   Marital status: Single    Spouse name: Not on file   Number of children: Not on file   Years of education: Not on file   Highest education level: Not on file  Occupational History   Not on file  Tobacco Use   Smoking status: Former    Types: Cigarettes    Quit date: 07/07/1972    Years since quitting: 48.9   Smokeless tobacco: Never  Vaping Use   Vaping Use: Never used  Substance and Sexual Activity   Alcohol use: Yes    Alcohol/week: 3.0 standard drinks    Types: 3 Glasses of wine per week   Drug use: No   Sexual activity: Not Currently  Other Topics Concern   Not on file  Social History Narrative   Not on file   Social Determinants of Health   Financial Resource Strain: Not on file  Food Insecurity: Not on file  Transportation Needs: Not on file  Physical Activity: Not on file  Stress: Not on file  Social Connections: Not on file     Family History: The patient's father pulmonary fibrosis- deceased. Mother- psychiatric disorders  ROS:   Please see the history of present illness.     All other systems reviewed and are negative.  EKGs/Labs/Other Studies Reviewed:    The following studies were reviewed today:   EKG:  EKG is  ordered today.  The ekg ordered today demonstrates   04/17/2021-NSR  Recent Labs: 03/30/2021: Magnesium 1.7; TSH 4.321 04/22/2021: ALT 21; BUN 10; Creatinine, Ser 0.55;  Hemoglobin 15.2; Platelets 220; Potassium 4.2; Sodium 130  Recent Lipid Panel    Component Value Date/Time   CHOL 179 10/15/2020 0934   TRIG 67 10/15/2020 0934   HDL 66 10/15/2020 0934   CHOLHDL 2.7 10/15/2020 0934   CHOLHDL 3.0 12/26/2014 0001   VLDL 19 12/26/2014 0001   LDLCALC 100 (H) 10/15/2020 0934     Risk Assessment/Calculations:    CHA2DS2-VASc Score =     This indicates a  % annual risk of stroke. The patient's score is based upon:     CHADS2VASC = 1       Physical Exam:    VS:    Vitals:   05/27/21 0754  BP: 130/82  Pulse: (!) 53  SpO2: 98%     Wt Readings from Last 3 Encounters:  04/23/21 168 lb 6.4 oz (76.4 kg)  04/17/21 163 lb 9.6 oz (74.2 kg)  04/05/21 168 lb (76.2 kg)     GEN:  Well nourished, well developed in no acute distress HEENT: Normal NECK: No JVD;  CARDIAC: RRR, no murmurs, rubs, gallops Vasc: 2+ BL radial RESPIRATORY:  Nl wob, Clear to auscultation ABDOMEN: Soft, non-tender, non-distended MUSCULOSKELETAL:  No edema; No deformity  SKIN: Warm and dry NEUROLOGIC:  Alert and oriented x 3 PSYCHIATRIC:  Normal affect   ASSESSMENT:    #Paroxysmal Atrial Fibrillation: Chads2vasc = 1 (Age) In the ED 04/22/2021 it was noted that he developed atrial fibrillation. He is s/p 30 days of eliquis. On ASA. HR 53 ok on metop, will continue to monitor. He asked about exercise , mild etoh, and caffeine; all are find in moderation and exercise is encouraged.  - continue metoprolol 25 mg daily - continue aspirin 81 mg daily  PLAN:    In order of problems listed above:  Follow up 6 months     Medication Adjustments/Labs and Tests Ordered: Current medicines are reviewed at length with the patient today.  Concerns regarding medicines are outlined above.    Signed, Janina Mayo, MD  05/27/2021 7:30 AM    [facility]

## 2021-05-27 NOTE — Patient Instructions (Signed)
Medication Instructions:  No Changes *If you need a refill on your cardiac medications before your next appointment, please call your pharmacy*   Lab Work: No Labs If you have labs (blood work) drawn today and your tests are completely normal, you will receive your results only by: [place] (if you have MyChart) OR A paper copy in the mail If you have any lab test that is abnormal or we need to change your treatment, we will call you to review the results.   Testing/Procedures: No Testing    Follow-Up: At [facility], you and your health needs are our priority.  As part of our continuing mission to provide you with exceptional heart care, we have created designated Provider Care Teams.  These Care Teams include your primary Cardiologist (physician) and Advanced Practice Providers (APPs -  Physician Assistants and Nurse Practitioners) who all work together to provide you with the care you need, when you need it.  We recommend signing up for the patient portal called "MyChart".  Sign up information is provided on this After Visit Summary.  MyChart is used to connect with patients for Virtual Visits (Telemedicine).  Patients are able to view lab/test results, encounter notes, upcoming appointments, etc.  Non-urgent messages can be sent to your provider as well.   To learn more about what you can do with MyChart, go to [email or web address].    Your next appointment:   6 month(s)  The format for your next appointment:   In Person  Provider:   Phineas Inches, MD If MD is not listed, click here to update    :1}

## 2021-10-23 ENCOUNTER — Encounter: Payer: BC Managed Care – PPO | Admitting: Family Medicine

## 2021-11-06 ENCOUNTER — Ambulatory Visit (INDEPENDENT_AMBULATORY_CARE_PROVIDER_SITE_OTHER): Payer: BC Managed Care – PPO | Admitting: Family Medicine

## 2021-11-06 ENCOUNTER — Encounter: Payer: Self-pay | Admitting: Family Medicine

## 2021-11-06 VITALS — BP 138/88 | HR 54 | Temp 96.8°F | Ht 69.0 in | Wt 172.0 lb

## 2021-11-06 DIAGNOSIS — K6282 Dysplasia of anus: Secondary | ICD-10-CM | POA: Diagnosis not present

## 2021-11-06 DIAGNOSIS — H409 Unspecified glaucoma: Secondary | ICD-10-CM

## 2021-11-06 DIAGNOSIS — Z8616 Personal history of COVID-19: Secondary | ICD-10-CM | POA: Insufficient documentation

## 2021-11-06 DIAGNOSIS — Z0000 Encounter for general adult medical examination without abnormal findings: Secondary | ICD-10-CM | POA: Diagnosis not present

## 2021-11-06 DIAGNOSIS — Z8679 Personal history of other diseases of the circulatory system: Secondary | ICD-10-CM | POA: Insufficient documentation

## 2021-11-06 DIAGNOSIS — E871 Hypo-osmolality and hyponatremia: Secondary | ICD-10-CM

## 2021-11-06 DIAGNOSIS — H9313 Tinnitus, bilateral: Secondary | ICD-10-CM

## 2021-11-06 DIAGNOSIS — Z13220 Encounter for screening for lipoid disorders: Secondary | ICD-10-CM

## 2021-11-06 NOTE — Progress Notes (Signed)
? ?  Subjective:  ? ? Patient ID: Kenneth Romero, male    DOB: [birth year], 71 y.o.   MRN: [id number] ? ?HPI ?He is here for complete examination.  He does have a previous history of COVID and following that he did have an episode of atrial fibrillation.  He was seen by cardiology and initially placed on Eliquis but that was stopped.  He is now taking Toprol 25 mg and seems to be tolerating this quite nicely.  He also has a history of AIN and has been seen by Dr. Benson Norway for this.  He is apparently not had any difficulties with that.  He is followed regularly by ophthalmology for his glaucoma.  He does have tinnitus however this does not bother him unless he thinks about it!  And apparently has been difficulty in the past with hyponatremia.  He has no other concerns or complaints.  Family and social history as well as health maintenance immunizations was reviewed. ? ? ?Review of Systems  ?All other systems reviewed and are negative. ? ?   ?Objective:  ? Physical Exam ?Alert and in no distress. Tympanic membranes and canals are normal. Pharyngeal area is normal. Neck is supple without adenopathy or thyromegaly. Cardiac exam shows a regular sinus rhythm without murmurs or gallops. Lungs are clear to auscultation. ? ? ? ? ?   ?Assessment & Plan:  ?Routine general medical examination at a health care facility ? ?Hyponatremia - Plan: Comprehensive metabolic panel ? ?History of COVID-19 ? ?Anal intraepithelial neoplasia I (AIN I) ? ?Glaucoma, unspecified glaucoma type, unspecified laterality ? ?History of atrial fibrillation - Plan: CBC with Differential/Platelet, Comprehensive metabolic panel ? ?Screening for lipid disorders - Plan: Lipid panel ? ?Tinnitus of both ears ?He will discuss with cardiology whether to continue on the Toprol.  Otherwise he seems to be doing quite nicely.  I will see him on an annual basis. ? ?

## 2021-11-07 LAB — CBC WITH DIFFERENTIAL/PLATELET
Basophils Absolute: 0 10*3/uL (ref 0.0–0.2)
Basos: 1 %
EOS (ABSOLUTE): 0.1 10*3/uL (ref 0.0–0.4)
Eos: 2 %
Hematocrit: 44 % (ref 37.5–51.0)
Hemoglobin: 15.1 g/dL (ref 13.0–17.7)
Immature Grans (Abs): 0 10*3/uL (ref 0.0–0.1)
Immature Granulocytes: 0 %
Lymphocytes Absolute: 1.8 10*3/uL (ref 0.7–3.1)
Lymphs: 29 %
MCH: 32 pg (ref 26.6–33.0)
MCHC: 34.3 g/dL (ref 31.5–35.7)
MCV: 93 fL (ref 79–97)
Monocytes Absolute: 0.5 10*3/uL (ref 0.1–0.9)
Monocytes: 9 %
Neutrophils Absolute: 3.8 10*3/uL (ref 1.4–7.0)
Neutrophils: 59 %
Platelets: 245 10*3/uL (ref 150–450)
RBC: 4.72 x10E6/uL (ref 4.14–5.80)
RDW: 12 % (ref 11.6–15.4)
WBC: 6.3 10*3/uL (ref 3.4–10.8)

## 2021-11-07 LAB — COMPREHENSIVE METABOLIC PANEL
ALT: 18 IU/L (ref 0–44)
AST: 24 IU/L (ref 0–40)
Albumin/Globulin Ratio: 2.4 — ABNORMAL HIGH (ref 1.2–2.2)
Albumin: 4.5 g/dL (ref 3.7–4.7)
Alkaline Phosphatase: 75 IU/L (ref 44–121)
BUN/Creatinine Ratio: 19 (ref 10–24)
BUN: 11 mg/dL (ref 8–27)
Bilirubin Total: 0.8 mg/dL (ref 0.0–1.2)
CO2: 25 mmol/L (ref 20–29)
Calcium: 9.5 mg/dL (ref 8.6–10.2)
Chloride: 94 mmol/L — ABNORMAL LOW (ref 96–106)
Creatinine, Ser: 0.57 mg/dL — ABNORMAL LOW (ref 0.76–1.27)
Globulin, Total: 1.9 g/dL (ref 1.5–4.5)
Glucose: 96 mg/dL (ref 70–99)
Potassium: 4.5 mmol/L (ref 3.5–5.2)
Sodium: 130 mmol/L — ABNORMAL LOW (ref 134–144)
Total Protein: 6.4 g/dL (ref 6.0–8.5)
eGFR: 104 mL/min/{1.73_m2} (ref >59)

## 2021-11-07 LAB — LIPID PANEL
Chol/HDL Ratio: 3 ratio (ref 0.0–5.0)
Cholesterol, Total: 197 mg/dL (ref 100–199)
HDL: 66 mg/dL (ref >39)
LDL Chol Calc (NIH): 113 mg/dL — ABNORMAL HIGH (ref 0–99)
Triglycerides: 99 mg/dL (ref 0–149)
VLDL Cholesterol Cal: 18 mg/dL (ref 5–40)

## 2021-11-08 ENCOUNTER — Telehealth: Payer: Self-pay

## 2021-11-08 NOTE — Telephone Encounter (Signed)
Lvm for pt to call back to make a nurse visit appt for repeat bmet. [place] ?

## 2021-11-11 ENCOUNTER — Ambulatory Visit: Payer: BC Managed Care – PPO | Admitting: Internal Medicine

## 2021-11-11 ENCOUNTER — Encounter: Payer: Self-pay | Admitting: Internal Medicine

## 2021-11-11 VITALS — BP 120/78 | HR 63 | Ht 71.0 in | Wt 173.0 lb

## 2021-11-11 DIAGNOSIS — R002 Palpitations: Secondary | ICD-10-CM

## 2021-11-11 MED ORDER — METOPROLOL SUCCINATE ER 25 MG PO TB24: 25.0000 mg | ORAL_TABLET | Freq: Every day | ORAL | 3 refills | Status: DC

## 2021-11-11 NOTE — Progress Notes (Signed)
?Cardiology Office Note:   ? ?Date:  11/11/2021  ? ?ID:  Kenneth Romero, DOB Aug 08, 1950, MRN [id number] ? ?PCP:  Denita Lung, MD ?  ?[facility] Providers ?Cardiologist:  None    ? ?Referring MD: Denita Lung, MD  ? ?No chief complaint on file. ? ?Atrial Fibrillation ? ?History of Present Illness:   ? ?Kenneth Romero is a 71 y.o. male with a hx of COVID19,  anal intraepithelial neoplasm, Glaucoma,seen in the ED for irregular heart rhythm, COVID+, initial  referral for c/f atrial fibrillation ? ?Patient recently had COVID, saw his primary provider and noted some irregular heart beats. He was started on metoprolol 25 mg daily and eliquis. No EKG has shown atrial fibrillation. No history of bleeding. He noted that afib was seen on tele. Not captured on 12 leads or media strips I've seen. He notes for 30 seconds for a minute he notes palpitations. Once he got COVID , palpitations were happening at night. The palpitations kept him from sleeping prior to going to the ED. He then went to the ER with concern. It lasted all night. IV metop push was given. His troponin was negative. He has no hx of CVD. Normal renal function. He denies angina,  some dyspnea on exertion, lower extremity edema, PND or orthopnea.  He does tire with stairs lately. No hx of OSA. TSH 4.3 ? ?Age 38 ?No hypertension ?No DMII ?No stroke hx ?No MI, No PAD ?No heart failure ? ?Social hx: Smoked when he was young stopped at 72. He is a Financial controller at [employer]. Very healthy, good diet, exercise and yoga. ? ?Interim 05/27/2021: ?Kenneth Romero has since stopped eliquis after 30 days as a CHADSCVASC of 1. He is taking ASA 81 mg daily. Today he states he is doing well. Blood pressures are in good control. Has no signs of sleep apnea. He is able to do his daily activities. He feels much better since COVID. 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 ? ?Interim Hx 11/11/2021: ? ?He feels well.  No changes in is health. 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 He also will be traveling to the [place] region and [place].  ? ?Cardiology Studies ?04/23/2021- Predominantly NSR. 8% Atrial fibrillation burden.  ?05/10/2021- Normal LV/RV function. Mild AI. No signiificant valve dx or pulm htn ? ? ?Past Medical History:  ?Diagnosis Date  ? Anal intraepithelial neoplasia I (AIN I)   ? Vitiligo   ? ? ?Past Surgical History:  ?Procedure Laterality Date  ? ANUS SURGERY    ? COLONOSCOPY  07/07/2004  ? DR.HUNG  ? SIGMOIDOSCOPY  07/07/2008  ? DR.HUNG  ? ? ?Current Medications: ?No outpatient medications have been marked as taking for the 11/11/21 encounter (Appointment) with Janina Mayo, MD.  ?  ? ?Allergies:   Sulfa antibiotics  ? ?Social History  ? ?Socioeconomic History  ? Marital status: Single  ?  Spouse name: Not on file  ? Number of children: Not on file  ? Years of education: Not on file  ? Highest education level: Not on file  ?Occupational History  ? Not on file  ?Tobacco Use  ? Smoking status: Former  ?  Types: Cigarettes  ?  Quit date: 07/07/1972  ?  Years since quitting: 49.3  ? Smokeless tobacco: Never  ?Vaping Use  ? Vaping Use: Never used  ?Substance and Sexual Activity  ? Alcohol use: Yes  ?  Alcohol/week: 3.0 standard drinks  ?  Types: 3 Glasses of wine per week  ? Drug use: No  ? Sexual activity: Not Currently  ?Other Topics Concern  ? Not on file  ?Social History Narrative  ? Not on file  ? ?Social Determinants of Health  ? ?Financial Resource Strain: Not on file  ?Food Insecurity: Not on file  ?Transportation Needs: Not on file  ?Physical Activity: Not on file  ?Stress: Not on file  ?Social Connections: Not on file  ?  ? ?Family History: ?The patient's father pulmonary fibrosis- deceased. Mother- psychiatric disorders ? ?ROS:   ?Please see the history of present illness.    ? All other systems reviewed and are negative. ? ?EKGs/Labs/Other Studies Reviewed:   ? ?The following studies were reviewed today: ? ? ?EKG:  EKG is  ordered  today.  The ekg ordered today demonstrates  ? ?04/17/2021-NSR ? ?11/11/2021-NSR ? ?Recent Labs: ?03/30/2021: Magnesium 1.7; TSH 4.321 ?11/06/2021: ALT 18; BUN 11; Creatinine, Ser 0.57; Hemoglobin 15.1; Platelets 245; Potassium 4.5; Sodium 130  ?Recent Lipid Panel ?   ?Component Value Date/Time  ? CHOL 197 11/06/2021 1537  ? TRIG 99 11/06/2021 1537  ? HDL 66 11/06/2021 1537  ? CHOLHDL 3.0 11/06/2021 1537  ? CHOLHDL 3.0 12/26/2014 0001  ? VLDL 19 12/26/2014 0001  ? LDLCALC 113 (H) 11/06/2021 1537  ? ? ? ?Risk Assessment/Calculations:   ? ?CHA2DS2-VASc Score =    ? This indicates a  % annual risk of stroke. ?The patient's score is based upon: ?   ? CHADS2VASC = 1 ? ?    ? ?Physical Exam:   ? ?VS:   ? ?Vitals:  ? 11/11/21 1005  ?BP: 120/78  ?Pulse: 63  ?SpO2: 97%  ? ? ? ?Wt Readings from Last 3 Encounters:  ?11/06/21 172 lb (78 kg)  ?05/27/21 167 lb 11.2 oz (76.1 kg)  ?04/23/21 168 lb 6.4 oz (76.4 kg)  ?  ? ?GEN:  Well nourished, well developed in no acute distress ?HEENT: Normal ?NECK: No JVD;  ?CARDIAC: RRR, no murmurs, rubs, gallops ?Vasc: 2+ BL radial ?RESPIRATORY:  Nl wob, Clear to auscultation ?ABDOMEN: Soft, non-tender, non-distended ?MUSCULOSKELETAL:  No edema; No deformity  ?SKIN: Warm and dry ?NEUROLOGIC:  Alert and oriented x 3 ?PSYCHIATRIC:  Normal affect  ? ?ASSESSMENT:   ? ?#Paroxysmal Atrial Fibrillation: Chads2vasc = 1 (Age) In the ED 04/22/2021 it was noted that he developed atrial fibrillation. He is s/p 30 days of eliquis. On ASA. HR 53 ok on metop, will continue to monitor. He asked about exercise , mild etoh, and caffeine; all are find in moderation and exercise is encouraged.  ?- continue metoprolol 25 mg daily ?- continue aspirin 81 mg daily ? ?PLAN:   ? ?In order of problems listed above: ? ?No changes today ?Follow up 6 months ? ? ?  ?Medication Adjustments/Labs and Tests Ordered: ?Current medicines are reviewed at length with the patient today.  Concerns regarding medicines are outlined above.   ? ? ?Signed, ?Janina Mayo, MD  ?11/11/2021 8:22 AM    ?[place] ? ?

## 2021-11-11 NOTE — Patient Instructions (Signed)
Medication Instructions:  ?Your physician recommends that you continue on your current medications as directed. Please refer to the Current Medication list given to you today. ? ?*If you need a refill on your cardiac medications before your next appointment, please call your pharmacy* ? ?Follow-Up: ?At [facility], you and your health needs are our priority.  As part of our continuing mission to provide you with exceptional heart care, we have created designated Provider Care Teams.  These Care Teams include your primary Cardiologist (physician) and Advanced Practice Providers (APPs -  Physician Assistants and Nurse Practitioners) who all work together to provide you with the care you need, when you need it. ? ?We recommend signing up for the patient portal called "MyChart".  Sign up information is provided on this After Visit Summary.  MyChart is used to connect with patients for Virtual Visits (Telemedicine).  Patients are able to view lab/test results, encounter notes, upcoming appointments, etc.  Non-urgent messages can be sent to your provider as well.   ?To learn more about what you can do with MyChart, go to [email or web address].   ? ?Your next appointment:   ?6 month(s) ? ?The format for your next appointment:   ?In Person ? ?Provider:   ?Dr. Harl Bowie ? ?Important Information About [place] ? ? ? ? ? ? ?

## 2021-11-25 ENCOUNTER — Other Ambulatory Visit: Payer: BC Managed Care – PPO

## 2021-11-25 ENCOUNTER — Other Ambulatory Visit: Payer: Self-pay

## 2021-11-25 DIAGNOSIS — E876 Hypokalemia: Secondary | ICD-10-CM

## 2021-11-26 LAB — BASIC METABOLIC PANEL
BUN/Creatinine Ratio: 22 (ref 10–24)
BUN: 12 mg/dL (ref 8–27)
CO2: 22 mmol/L (ref 20–29)
Calcium: 9.4 mg/dL (ref 8.6–10.2)
Chloride: 97 mmol/L (ref 96–106)
Creatinine, Ser: 0.55 mg/dL — ABNORMAL LOW (ref 0.76–1.27)
Glucose: 87 mg/dL (ref 70–99)
Potassium: 4.3 mmol/L (ref 3.5–5.2)
Sodium: 131 mmol/L — ABNORMAL LOW (ref 134–144)
eGFR: 105 mL/min/{1.73_m2} (ref >59)

## 2022-01-21 ENCOUNTER — Encounter: Payer: Self-pay | Admitting: Internal Medicine

## 2022-01-21 ENCOUNTER — Telehealth: Payer: Self-pay | Admitting: Internal Medicine

## 2022-01-21 NOTE — Telephone Encounter (Signed)
Spoke with pt, he has recently return from traveling in [place] and several times while he was away he had episodes of elevated heart rate. He reports last night he was awakened by the cat at 12 am and then developed a rapid heart rate that last until 6 am this morning. He has only noticed the elevated heart rate once during the day but that was when he was working hard in the yard. He rested and felt better. He is not sure if this is atrial fib or not and he usually notices the episodes in the evenings. His blood pressure usually runs 130's/90's. He has no chest pain or SOB with the elevated heart rates. He wanted to make dr branch aware. Will forward to dr branch to review and advise.

## 2022-01-21 NOTE — Telephone Encounter (Signed)
Pt c/o medication issue:  1. Name of Medication:   metoprolol succinate (TOPROL-XL) 25 MG 24 hr tablet    2. How are you currently taking this medication (dosage and times per day)? Take 1 tablet (25 mg total) by mouth daily  3. Are you having a reaction (difficulty breathing--STAT)? No  4. What is your medication issue? Pt states that he does not think medication is working. Pt says he has still been having irregular heartbeats. He would like a nurse to return call. Please advise

## 2022-01-24 NOTE — Telephone Encounter (Signed)
Patient reported palpitations during the night that lasted about 3 hours. He denies chest pain, sob, dizziness. He stated he does not have these episode during the day. Is it possible to split his metoprolol doses to AM and PM or to increase his metoprolol succinate dose?

## 2022-01-24 NOTE — Telephone Encounter (Signed)
Patient c/o Palpitations:  High priority if patient c/o lightheadedness, shortness of breath, or chest pain  How long have you had palpitations/irregular HR/ Afib? Are you having the symptoms now?   Patient states last he he has another episode of elevated HR and feeling his heart racing. Episode occurred around 11:45 PM last night and lasted about 3 hours. He states he is back in rhythm now.  Are you currently experiencing lightheadedness, SOB or CP?  No   Do you have a history of afib (atrial fibrillation) or irregular heart rhythm?  Yes   Have you checked your BP or HR? (document readings if available):  139/96 101 (1:00 am)  129/88 67 (8:00 am)  Are you experiencing any other symptoms?  No

## 2022-01-27 ENCOUNTER — Other Ambulatory Visit: Payer: Self-pay | Admitting: Internal Medicine

## 2022-01-27 ENCOUNTER — Encounter: Payer: Self-pay | Admitting: Internal Medicine

## 2022-01-27 DIAGNOSIS — R002 Palpitations: Secondary | ICD-10-CM

## 2022-01-27 MED ORDER — DILTIAZEM HCL 30 MG PO TABS: 30.0000 mg | ORAL_TABLET | Freq: Four times a day (QID) | ORAL | 1 refills | Status: DC | PRN

## 2022-01-28 NOTE — Telephone Encounter (Signed)
Please see MyChart encounter.

## 2022-03-12 ENCOUNTER — Encounter: Payer: Self-pay | Admitting: Internal Medicine

## 2022-04-15 ENCOUNTER — Encounter: Payer: Self-pay | Admitting: Internal Medicine

## 2022-04-28 ENCOUNTER — Encounter: Payer: Self-pay | Admitting: Internal Medicine

## 2022-05-01 ENCOUNTER — Telehealth: Payer: Self-pay | Admitting: Family Medicine

## 2022-05-01 NOTE — Telephone Encounter (Signed)
Pt wants to know if you recommend him getting the RSV shot & if so he is scheduled here for flu shot next Friday & when should he get the RSV after the flu shot?

## 2022-05-01 NOTE — Telephone Encounter (Signed)
Left message for pt

## 2022-05-09 ENCOUNTER — Other Ambulatory Visit (INDEPENDENT_AMBULATORY_CARE_PROVIDER_SITE_OTHER): Payer: BC Managed Care – PPO

## 2022-05-09 DIAGNOSIS — Z23 Encounter for immunization: Secondary | ICD-10-CM | POA: Diagnosis not present

## 2022-05-19 ENCOUNTER — Encounter: Payer: Self-pay | Admitting: Internal Medicine

## 2022-05-19 ENCOUNTER — Ambulatory Visit: Payer: BC Managed Care – PPO | Attending: Internal Medicine | Admitting: Internal Medicine

## 2022-05-19 VITALS — BP 116/74 | HR 61 | Ht 71.0 in | Wt 167.6 lb

## 2022-05-19 DIAGNOSIS — R002 Palpitations: Secondary | ICD-10-CM | POA: Diagnosis not present

## 2022-05-19 NOTE — Patient Instructions (Addendum)
Medication Instructions:  No Changes In Medications at this time.  *If you need a refill on your cardiac medications before your next appointment, please call your pharmacy*  Lab Work: None Ordered At This Time.  If you have labs (blood work) drawn today and your tests are completely normal, you will receive your results only by: [place] (if you have MyChart) OR A paper copy in the mail If you have any lab test that is abnormal or we need to change your treatment, we will call you to review the results.  Testing/Procedures: None Ordered At This Time.   Follow-Up: At [facility], you and your health needs are our priority.  As part of our continuing mission to provide you with exceptional heart care, we have created designated Provider Care Teams.  These Care Teams include your primary Cardiologist (physician) and Advanced Practice Providers (APPs -  Physician Assistants and Nurse Practitioners) who all work together to provide you with the care you need, when you need it.  Your next appointment:   1 year(s)  The format for your next appointment:   In Person  Provider:   Dr. Harl Bowie

## 2022-05-19 NOTE — Progress Notes (Signed)
Cardiology Office Note:    Date:  05/19/2022   ID:  Kenneth Romero, DOB [birth year], MRN [id number]  PCP:  Denita Lung, MD   [facility] Providers Cardiologist:  None     Referring MD: Denita Lung, MD   No chief complaint on file.  Atrial Fibrillation  History of Present Illness:    Kenneth Romero is a 71 y.o. male with a hx of COVID19,  anal intraepithelial neoplasm, Glaucoma,seen in the ED for irregular heart rhythm, COVID+, initial  referral for c/f atrial fibrillation  Patient recently had COVID, saw his primary provider and noted some irregular heart beats. He was started on metoprolol 25 mg daily and eliquis. No EKG has shown atrial fibrillation. No history of bleeding. He noted that afib was seen on tele. Not captured on 12 leads or media strips I've seen. He notes for 30 seconds for a minute he notes palpitations. Once he got COVID , palpitations were happening at night. The palpitations kept him from sleeping prior to going to the ED. He then went to the ER with concern. It lasted all night. IV metop push was given. His troponin was negative. He has no hx of CVD. Normal renal function. He denies angina,  some dyspnea on exertion, lower extremity edema, PND or orthopnea.  He does tire with stairs lately. No hx of OSA. TSH 4.3  Age 63 No hypertension No DMII No stroke hx No MI, No PAD No heart failure  Social hx: Smoked when he was young stopped at 11. He is a Financial controller at [employer]. Very healthy, good diet, exercise and yoga.  Interim 05/27/2021: Kenneth Romero has since stopped eliquis after 30 days as a CHADSCVASC of 1. He is taking ASA 81 mg daily. Today he states he is doing well. Blood pressures are in good control. Has no signs of sleep apnea. He is able to do his daily activities. He feels much better since COVID. 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  Interim Hx 11/11/2021:  He feels well.  No changes in is health. 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 He also will be traveling to the [place] region and [place].   Interim Hx 05/19/2022 No palpitations recently. Dilt Prn with prior symptoms over the summer.  Cardiology Studies 04/23/2021- Predominantly NSR. 8% Atrial fibrillation burden.  05/10/2021- Normal LV/RV function. Mild AI. No signiificant valve dx or pulm htn   Past Medical History:  Diagnosis Date   Anal intraepithelial neoplasia I (AIN I)    Vitiligo     Past Surgical History:  Procedure Laterality Date   ANUS SURGERY     COLONOSCOPY  07/07/2004   DR.HUNG   SIGMOIDOSCOPY  07/07/2008   DR.HUNG    Current Medications: Current Meds  Medication Sig   acetaminophen (TYLENOL) 325 MG tablet Take 2 tablets (650 mg total) by mouth every 6 (six) hours as needed for headache.   Ascorbic Acid (VITAMIN C) 1000 MG tablet Take 1,000 mg by mouth 3 (three) times daily.   aspirin 81 MG tablet Take 81 mg by mouth daily.   fish oil-omega-3 fatty acids 1000 MG capsule Take 1 g by mouth daily.   Garlic 130 MG TABS Take 1 tablet by mouth 1 day or 1 dose.   metoprolol succinate (TOPROL-XL) 25 MG 24 hr tablet Take 1 tablet (25 mg total) by mouth daily.   milk thistle 175 MG tablet Take  175 mg by mouth daily.   Multiple Vitamins-Minerals (MULTIVITAMIN WITH MINERALS) tablet Take 1 tablet by mouth daily.   naproxen sodium (ALEVE) 220 MG tablet Take 220 mg by mouth daily as needed (pain).   VYZULTA 0.024 % SOLN Place 1 drop into both eyes at bedtime.     Allergies:   Sulfa antibiotics   Social History   Socioeconomic History   Marital status: Single    Spouse name: Not on file   Number of children: Not on file   Years of education: Not on file   Highest education level: Not on file  Occupational History   Not on file  Tobacco Use   Smoking status: Former    Types: Cigarettes    Quit date: 07/07/1972    Years since quitting: 49.8   Smokeless tobacco: Never  Vaping Use   Vaping Use: Never used   Substance and Sexual Activity   Alcohol use: Yes    Alcohol/week: 3.0 standard drinks of alcohol    Types: 3 Glasses of wine per week   Drug use: No   Sexual activity: Not Currently  Other Topics Concern   Not on file  Social History Narrative   Not on file   Social Determinants of Health   Financial Resource Strain: Not on file  Food Insecurity: Not on file  Transportation Needs: Not on file  Physical Activity: Not on file  Stress: Not on file  Social Connections: Not on file     Family History: The patient's father pulmonary fibrosis- deceased. Mother- psychiatric disorders  ROS:   Please see the history of present illness.     All other systems reviewed and are negative.  EKGs/Labs/Other Studies Reviewed:    The following studies were reviewed today:   EKG:  EKG is  ordered today.  The ekg ordered today demonstrates   04/17/2021-NSR  11/11/2021-NSR  05/19/2022- NS with sinus arrhythmia  Recent Labs: 11/06/2021: ALT 18; Hemoglobin 15.1; Platelets 245 11/25/2021: BUN 12; Creatinine, Ser 0.55; Potassium 4.3; Sodium 131   Recent Lipid Panel    Component Value Date/Time   CHOL 197 11/06/2021 1537   TRIG 99 11/06/2021 1537   HDL 66 11/06/2021 1537   CHOLHDL 3.0 11/06/2021 1537   CHOLHDL 3.0 12/26/2014 0001   VLDL 19 12/26/2014 0001   LDLCALC 113 (H) 11/06/2021 1537     Risk Assessment/Calculations:    CHA2DS2-VASc Score =     This indicates a  % annual risk of stroke. The patient's score is based upon:     CHADS2VASC = 1       Physical Exam:    VS:    Vitals:   05/19/22 0857  BP: 116/74  Pulse: 61  SpO2: 98%     Wt Readings from Last 3 Encounters:  05/19/22 167 lb 9.6 oz (76 kg)  11/11/21 173 lb (78.5 kg)  11/06/21 172 lb (78 kg)     GEN:  Well nourished, well developed in no acute distress HEENT: Normal NECK: No JVD;  CARDIAC: RRR, no murmurs, rubs, gallops Vasc: 2+ BL radial RESPIRATORY:  Nl wob, Clear to auscultation ABDOMEN:  Soft, non-tender, non-distended MUSCULOSKELETAL:  No edema; No deformity  SKIN: Warm and dry NEUROLOGIC:  Alert and oriented x 3 PSYCHIATRIC:  Normal affect   ASSESSMENT:    #Paroxysmal Atrial Fibrillation: Chads2vasc = 1 (Age) In the ED 04/22/2021 it was noted that he developed atrial fibrillation. He was initially on eliquis in the event that he  would require DCCV. Now on ASA with low stroke risk. Rate control strategy for now, unless AF becomes persistent. - in sinus today - continue metoprolol 25 mg daily - continue dilt 30 mg 4 times daily PRN - continue aspirin 81 mg daily  PLAN:    In order of problems listed above:  No changes today Follow up 12 months     Medication Adjustments/Labs and Tests Ordered: Current medicines are reviewed at length with the patient today.  Concerns regarding medicines are outlined above.    Signed, Janina Mayo, MD  05/19/2022 9:06 AM    Kenneth Romero

## 2022-10-30 ENCOUNTER — Other Ambulatory Visit: Payer: Self-pay | Admitting: *Deleted

## 2022-10-30 MED ORDER — METOPROLOL SUCCINATE ER 25 MG PO TB24: 25.0000 mg | ORAL_TABLET | Freq: Every day | ORAL | 2 refills | Status: DC

## 2022-11-17 ENCOUNTER — Ambulatory Visit: Payer: BC Managed Care – PPO | Admitting: Family Medicine

## 2022-12-16 ENCOUNTER — Encounter: Payer: Self-pay | Admitting: Family Medicine

## 2022-12-16 ENCOUNTER — Ambulatory Visit (INDEPENDENT_AMBULATORY_CARE_PROVIDER_SITE_OTHER): Payer: BC Managed Care – PPO | Admitting: Family Medicine

## 2022-12-16 VITALS — BP 120/82 | HR 43 | Temp 99.1°F | Resp 16 | Ht 71.0 in | Wt 165.0 lb

## 2022-12-16 DIAGNOSIS — M1990 Unspecified osteoarthritis, unspecified site: Secondary | ICD-10-CM

## 2022-12-16 DIAGNOSIS — Z13220 Encounter for screening for lipoid disorders: Secondary | ICD-10-CM | POA: Diagnosis not present

## 2022-12-16 DIAGNOSIS — E785 Hyperlipidemia, unspecified: Secondary | ICD-10-CM

## 2022-12-16 DIAGNOSIS — Z23 Encounter for immunization: Secondary | ICD-10-CM | POA: Diagnosis not present

## 2022-12-16 DIAGNOSIS — Z0000 Encounter for general adult medical examination without abnormal findings: Secondary | ICD-10-CM

## 2022-12-16 DIAGNOSIS — H409 Unspecified glaucoma: Secondary | ICD-10-CM

## 2022-12-16 DIAGNOSIS — K6282 Dysplasia of anus: Secondary | ICD-10-CM

## 2022-12-16 DIAGNOSIS — Z8679 Personal history of other diseases of the circulatory system: Secondary | ICD-10-CM | POA: Diagnosis not present

## 2022-12-16 DIAGNOSIS — H9313 Tinnitus, bilateral: Secondary | ICD-10-CM

## 2022-12-16 DIAGNOSIS — H9193 Unspecified hearing loss, bilateral: Secondary | ICD-10-CM

## 2022-12-16 LAB — POCT URINALYSIS DIP (CLINITEK)
Bilirubin, UA: NEGATIVE
Blood, UA: NEGATIVE
Glucose, UA: NEGATIVE mg/dL
Ketones, POC UA: NEGATIVE mg/dL
Nitrite, UA: NEGATIVE
POC PROTEIN,UA: NEGATIVE
Spec Grav, UA: 1.01 (ref 1.010–1.025)
Urobilinogen, UA: 0.2 E.U./dL
pH, UA: 7.5 (ref 5.0–8.0)

## 2022-12-16 LAB — LIPID PANEL

## 2022-12-16 MED ORDER — METOPROLOL SUCCINATE ER 25 MG PO TB24: 25.0000 mg | ORAL_TABLET | Freq: Every day | ORAL | 3 refills | Status: DC

## 2022-12-16 NOTE — Progress Notes (Signed)
Complete physical exam  Patient: Kenneth Romero   DOB: February 09, 1951   72 y.o. Male  MRN: [id number]  Subjective:    Chief Complaint  Patient presents with   Annual Exam    Fasting.     Kenneth Romero is a 72 y.o. male who presents today for a complete physical exam. He reports consuming a general diet. Home exercise routine includes walking daily, also cardio, yoga and weight lifting at the gym. He generally feels fairly well. He reports sleeping fairly well. He does have a history of tinnitus and is concerned over his inability to hear people in crowded rooms.  He would like this further evaluated.  He also has some difficulty with arthritis is presently not taking any medications.  He continues on metoprolol and is having no difficulty with that.  He does have a previous history of A-fib but has not had difficulty with that in quite some time.  Continues on metoprolol.  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  He will be going on Medicare within the next several months.  Otherwise he has no particular concerns.  Family and social history as well as health maintenance and immunizations was reviewed.    Most recent fall risk assessment:    12/16/2022    1:44 PM  Fall Risk   Falls in the past year? 0  Number falls in past yr: 0  Injury with Fall? 0  Risk for fall due to : No Fall Risks  Follow up Falls evaluation completed     Most recent depression screenings:    12/16/2022    1:44 PM 11/06/2021    2:29 PM  PHQ 2/9 Scores  PHQ - 2 Score 0 0  PHQ- 9 Score  0    Vision:Within last year and Dental: Receives regular dental care    Patient Care Team: Ronnald Nian, MD as PCP - General (Family Medicine) Maisie Fus, MD as PCP - Cardiology (Cardiology)   Outpatient Medications Prior to Visit  Medication Sig Note   acetaminophen (TYLENOL) 325 MG tablet Take 2 tablets (650 mg total) by mouth every 6 (six) hours as needed  for headache.    Ascorbic Acid (VITAMIN C) 1000 MG tablet Take 1,000 mg by mouth 3 (three) times daily.    fish oil-omega-3 fatty acids 1000 MG capsule Take 1 g by mouth daily.    Garlic 100 MG TABS Take 1 tablet by mouth 1 day or 1 dose.    metoprolol succinate (TOPROL-XL) 25 MG 24 hr tablet Take 1 tablet (25 mg total) by mouth daily.    milk thistle 175 MG tablet Take 175 mg by mouth daily.    Multiple Vitamins-Minerals (MULTIVITAMIN WITH MINERALS) tablet Take 1 tablet by mouth daily.    VYZULTA 0.024 % SOLN Place 1 drop into both eyes at bedtime.    aspirin 81 MG tablet Take 81 mg by mouth daily. (Patient not taking: Reported on 12/16/2022)    diltiazem (CARDIZEM) 30 MG tablet Take 1 tablet (30 mg total) by mouth 4 (four) times daily as needed (palpitations). (Patient not taking: Reported on 05/19/2022) 12/16/2022: prn   [DISCONTINUED] naproxen sodium (ALEVE) 220 MG tablet Take 220 mg by mouth daily as needed (pain). (Patient not taking: Reported on 12/16/2022)    No facility-administered medications prior to visit.   The 10-year ASCVD risk score (Arnett DK, et al., 2019) is: 17.8%  Values used to calculate the score:     Age: 2 years     Sex: Male     Is Non-Hispanic African American: No     Diabetic: No     Tobacco smoker: No     Systolic Blood Pressure: 120 mmHg     Is BP treated: No     HDL Cholesterol: 66 mg/dL     Total Cholesterol: 197 mg/dL  Review of Systems  All other systems reviewed and are negative.         Objective:     BP 120/82   Pulse (!) 43   Temp 99.1 F (37.3 C) (Oral)   Resp 16   Ht 5\' 11"  (1.803 m)   Wt 165 lb (74.8 kg)   SpO2 96% Comment: room air  BMI 23.01 kg/m    Physical Exam      Alert and in no distress. Tympanic membranes and canals are normal. Pharyngeal area is normal. Neck is supple without adenopathy or thyromegaly. Cardiac exam shows a regular sinus rhythm without murmurs or gallops. Lungs are clear to auscultation. Hearing  test does show some slight high-frequency hearing loss. Assessment & Plan:    Routine general medical examination at a health care facility - Plan: CBC with Differential/Platelet, Comprehensive metabolic panel, Lipid panel, POCT URINALYSIS DIP (CLINITEK)  Glaucoma of both eyes, unspecified glaucoma type  History of atrial fibrillation  Screening for lipid disorders - Plan: Lipid panel  Need for Tdap vaccination - Plan: Tdap vaccine greater than or equal to 7yo IM  Decreased hearing of both ears - Plan: Ambulatory referral to Audiology  Tinnitus of both ears  Arthritis  Immunization History  Administered Date(s) Administered   Fluad Quad(high Dose 65+) 06/04/2020, 05/09/2022   Hepatitis A 06/07/2001, 07/23/2001   Hepatitis B 06/07/2001, 07/23/2001, 01/18/2002   Influenza Split 05/07/2012   Influenza, High Dose Seasonal PF 04/18/2016, 08/06/2017, 06/04/2018   Influenza-Unspecified 06/04/2018, 04/20/2019, 03/15/2021   PFIZER Comirnaty(Gray Top)Covid-19 Tri-Sucrose Vaccine 10/15/2020   PFIZER(Purple Top)SARS-COV-2 Vaccination 08/18/2019, 09/12/2019, 04/02/2020   Pfizer Covid-19 Vaccine Bivalent Booster 30yrs & up 03/15/2021   Pneumococcal Conjugate-13 12/26/2014   Pneumococcal Polysaccharide-23 07/08/1999   Td 08/01/1993, 11/16/2003   Tdap 11/16/2003, 11/04/2013   Zoster Recombinat (Shingrix) 08/06/2017, 11/03/2017   Zoster, Live 08/13/2009    Health Maintenance  Topic Date Due   COVID-19 Vaccine (6 - 2023-24 season) 03/07/2022   Pneumonia Vaccine 11+ Years old (3 of 3 - PPSV23 or PCV20) 12/09/2028 (Originally 12/26/2019)   INFLUENZA VACCINE  02/05/2023   DTaP/Tdap/Td (5 - Td or Tdap) 11/05/2023   Colonoscopy  11/17/2028   Hepatitis C Screening  Completed   Zoster Vaccines- Shingrix  Completed   HPV VACCINES  Aged Out    Discussed the hearing issue with him and referred to audiology to 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  Also recommend Tylenol for  the aches and pains of arthritis on an as-needed basis.  Did recommend that he stop taking aspirin.  Urinalysis was contaminated. Problem List Items Addressed This Visit     Glaucoma   History of atrial fibrillation   Tinnitus of both ears   Other Visit Diagnoses     Routine general medical examination at a health care facility    -  Primary   Relevant Orders   CBC with Differential/Platelet   Comprehensive metabolic panel   Lipid panel   POCT URINALYSIS DIP (CLINITEK) (Completed)   Screening for lipid  disorders       Relevant Orders   Lipid panel   Need for Tdap vaccination       Relevant Orders   Tdap vaccine greater than or equal to 7yo IM   Decreased hearing of both ears       Relevant Orders   Ambulatory referral to Audiology   Arthritis          Discussed the hearing with him and will refer to audiology.  Also explained that he is at risk for cardiovascular disease and will place him on a statin to help reduce this.  He expressed understanding of this.    Sharlot Gowda, MD

## 2022-12-17 ENCOUNTER — Encounter: Payer: Self-pay | Admitting: Family Medicine

## 2022-12-17 LAB — COMPREHENSIVE METABOLIC PANEL
ALT: 18 IU/L (ref 0–44)
AST: 22 IU/L (ref 0–40)
Albumin/Globulin Ratio: 2.6
Albumin: 4.4 g/dL (ref 3.8–4.8)
Alkaline Phosphatase: 78 IU/L (ref 44–121)
BUN/Creatinine Ratio: 18 (ref 10–24)
BUN: 11 mg/dL (ref 8–27)
Bilirubin Total: 0.9 mg/dL (ref 0.0–1.2)
CO2: 21 mmol/L (ref 20–29)
Calcium: 9.3 mg/dL (ref 8.6–10.2)
Chloride: 99 mmol/L (ref 96–106)
Creatinine, Ser: 0.62 mg/dL — ABNORMAL LOW (ref 0.76–1.27)
Globulin, Total: 1.7 g/dL (ref 1.5–4.5)
Glucose: 91 mg/dL (ref 70–99)
Potassium: 4.4 mmol/L (ref 3.5–5.2)
Sodium: 134 mmol/L (ref 134–144)
Total Protein: 6.1 g/dL (ref 6.0–8.5)
eGFR: 101 mL/min/{1.73_m2} (ref >59)

## 2022-12-17 LAB — CBC WITH DIFFERENTIAL/PLATELET
Basophils Absolute: 0 10*3/uL (ref 0.0–0.2)
Basos: 1 %
EOS (ABSOLUTE): 0.1 10*3/uL (ref 0.0–0.4)
Eos: 2 %
Hematocrit: 44.2 % (ref 37.5–51.0)
Hemoglobin: 14.9 g/dL (ref 13.0–17.7)
Immature Grans (Abs): 0 10*3/uL (ref 0.0–0.1)
Immature Granulocytes: 0 %
Lymphocytes Absolute: 1.6 10*3/uL (ref 0.7–3.1)
Lymphs: 31 %
MCH: 31.7 pg (ref 26.6–33.0)
MCHC: 33.7 g/dL (ref 31.5–35.7)
MCV: 94 fL (ref 79–97)
Monocytes Absolute: 0.6 10*3/uL (ref 0.1–0.9)
Monocytes: 10 %
Neutrophils Absolute: 3 10*3/uL (ref 1.4–7.0)
Neutrophils: 56 %
Platelets: 229 10*3/uL (ref 150–450)
RBC: 4.7 x10E6/uL (ref 4.14–5.80)
RDW: 12.2 % (ref 11.6–15.4)
WBC: 5.3 10*3/uL (ref 3.4–10.8)

## 2022-12-17 LAB — LIPID PANEL
Chol/HDL Ratio: 2.5 ratio (ref 0.0–5.0)
Cholesterol, Total: 176 mg/dL (ref 100–199)
HDL: 71 mg/dL (ref >39)
LDL Chol Calc (NIH): 92 mg/dL (ref 0–99)
Triglycerides: 67 mg/dL (ref 0–149)
VLDL Cholesterol Cal: 13 mg/dL (ref 5–40)

## 2022-12-17 MED ORDER — ATORVASTATIN CALCIUM 20 MG PO TABS: 20.0000 mg | ORAL_TABLET | Freq: Every day | ORAL | 3 refills | Status: DC

## 2022-12-17 NOTE — Addendum Note (Signed)
Addended by: Ronnald Nian on: 12/17/2022 01:27 PM   Modules accepted: Orders

## 2022-12-26 ENCOUNTER — Ambulatory Visit: Payer: BC Managed Care – PPO | Attending: Family Medicine | Admitting: Audiologist

## 2022-12-26 DIAGNOSIS — H903 Sensorineural hearing loss, bilateral: Secondary | ICD-10-CM | POA: Diagnosis present

## 2022-12-26 DIAGNOSIS — H9313 Tinnitus, bilateral: Secondary | ICD-10-CM | POA: Insufficient documentation

## 2022-12-26 NOTE — Procedures (Signed)
  Outpatient Audiology and [facility] [address] [place], [place]  [address] [phone]  AUDIOLOGICAL  EVALUATION  NAME: Kenneth Romero     DOB:   1950/10/13      MRN: [id number]                                                                                     DATE: 12/26/2022     REFERENT: Ronnald Nian, MD STATUS: Outpatient DIAGNOSIS: Sensorineural Hearing Loss Bilateral    History: Kenneth Romero was seen for an audiological evaluation.  Kenneth Romero is receiving a hearing evaluation due to concerns for difficulty hearing in noise. Kenneth Romero has difficulty hearing in restaurants. This difficulty began gradually. No pain or pressure reported in either ear. Tinnitus is present but not bothersome in both ears. 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 No other relevant case history reported.   Evaluation:  Otoscopy showed a clear view of the tympanic membranes, bilaterally Tympanometry results were consistent with normal middle ear function, bilaterally   Audiometric testing was completed using conventional audiometry with supraural transducer. Speech Recognition Thresholds were 35dB in the right ear and 25dB in the left ear. Word Recognition was performed  40dB SL, scored  100% in the right ear and 100% in the left ear. Pure tone thresholds show normal sloping after 3kHz to moderateoy severe high pitched sensorineural hearing loss in both ears. Quick Speech in Noise Test (QuickSIN):  list of six sentences with five key words per sentence is presented in four-talker babble noise. Brode scored 3.5dB SNR. Normal is 0-3dB SNR. Mild difficulty in noise.  Results:  The test results were reviewed with Kenneth Romero. He has a high pitched sensorineural hearing loss and mild difficulty in noise. He is not yet hearing aid candidate but still needs to monitor his hearing. He was given a copy of his audiogram.   Recommendations: 1.   Test hearing annually unless further hearing  concerns arise, then see audiology for follow up sooner.   23 minutes spent testing and counseling on results.   Ammie Ferrier  Audiologist, Au.D., CCC-A 12/26/2022  11:33 AM  Cc: Ronnald Nian, MD

## 2023-03-29 IMAGING — DX DG CHEST 1V PORT
1 series · 1 of 1 positions shown · non-contrast
Comparison: April 02, 2007.

CLINICAL DATA: Chest pain.

EXAM:
PORTABLE CHEST 1 VIEW

[chest ap]
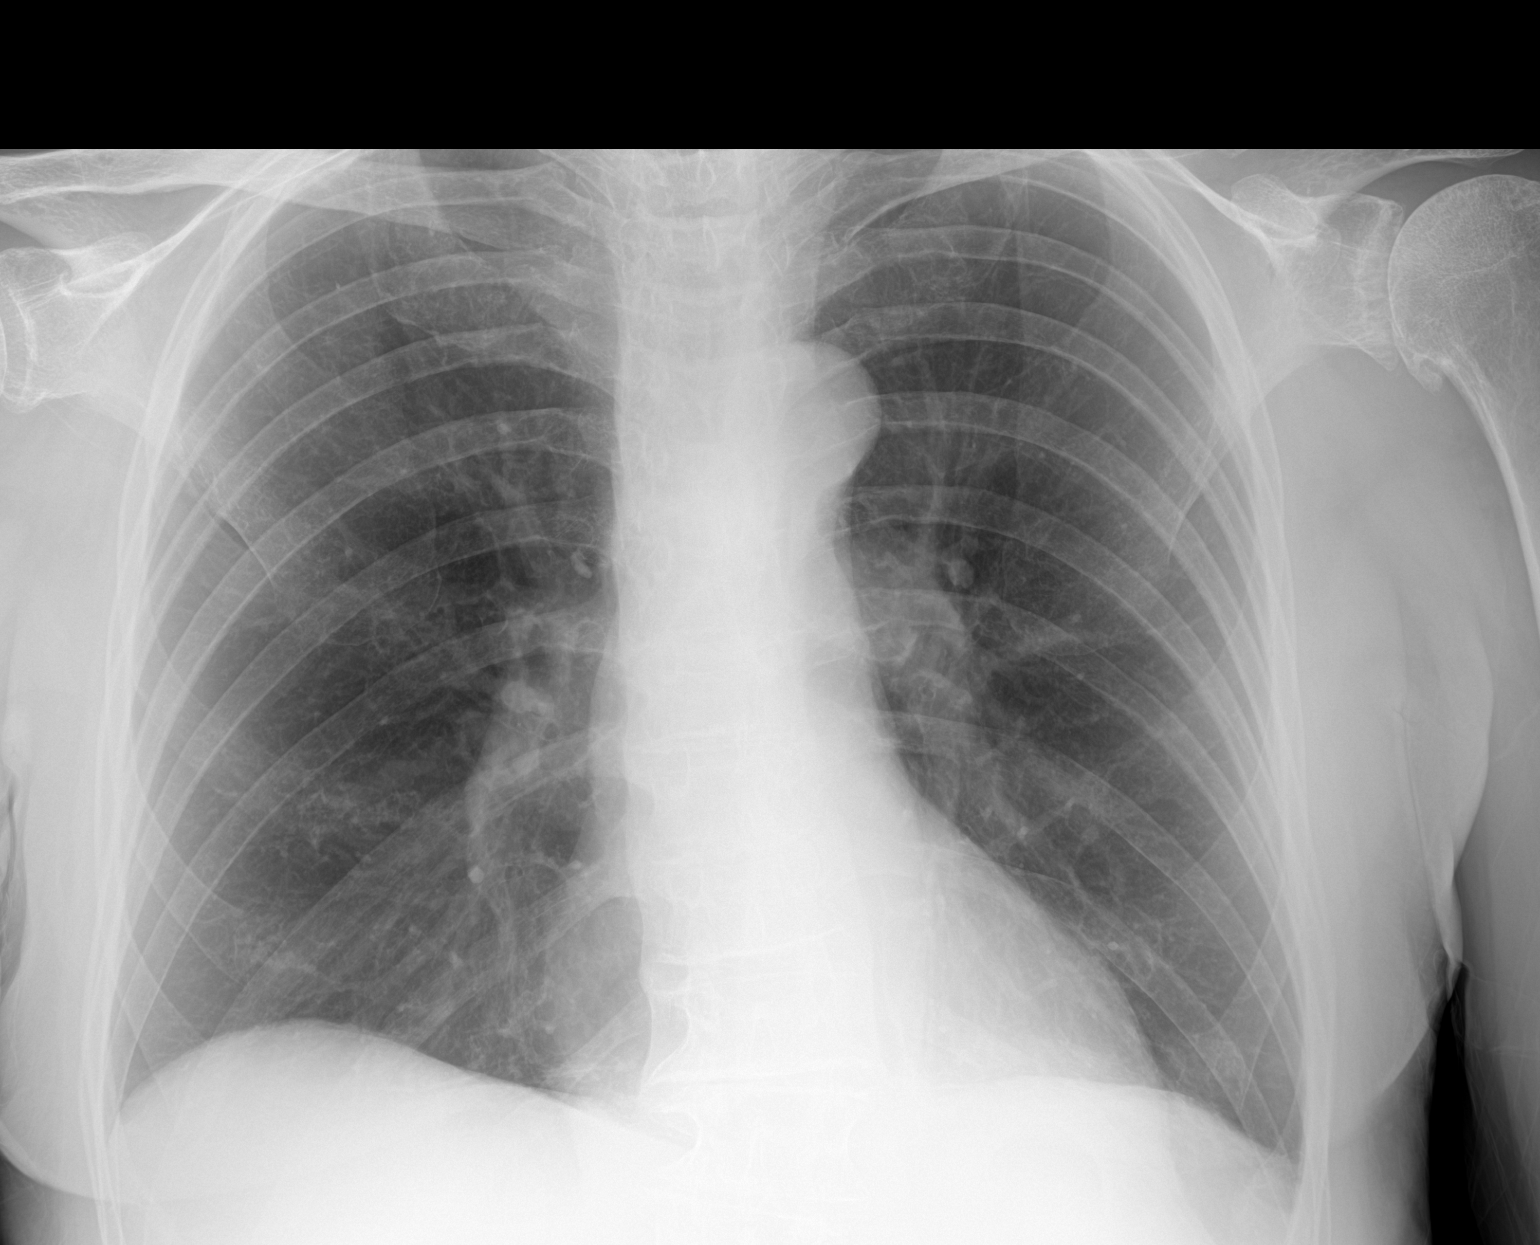

[1 of 1 positions shown; findings below may reference images not displayed]

FINDINGS: The heart size and mediastinal contours are within normal limits.
Both lungs are clear. The visualized skeletal structures are
unremarkable.
IMPRESSION: No active disease.

## 2023-03-31 ENCOUNTER — Other Ambulatory Visit (INDEPENDENT_AMBULATORY_CARE_PROVIDER_SITE_OTHER): Payer: Medicare PPO

## 2023-03-31 DIAGNOSIS — Z23 Encounter for immunization: Secondary | ICD-10-CM | POA: Diagnosis not present

## 2023-04-21 IMAGING — DX DG CHEST 1V PORT
1 series · 1 of 1 positions shown · non-contrast
Comparison: 04/17/2021

CLINICAL DATA: AFib

EXAM:
PORTABLE CHEST 1 VIEW

[chest ap]
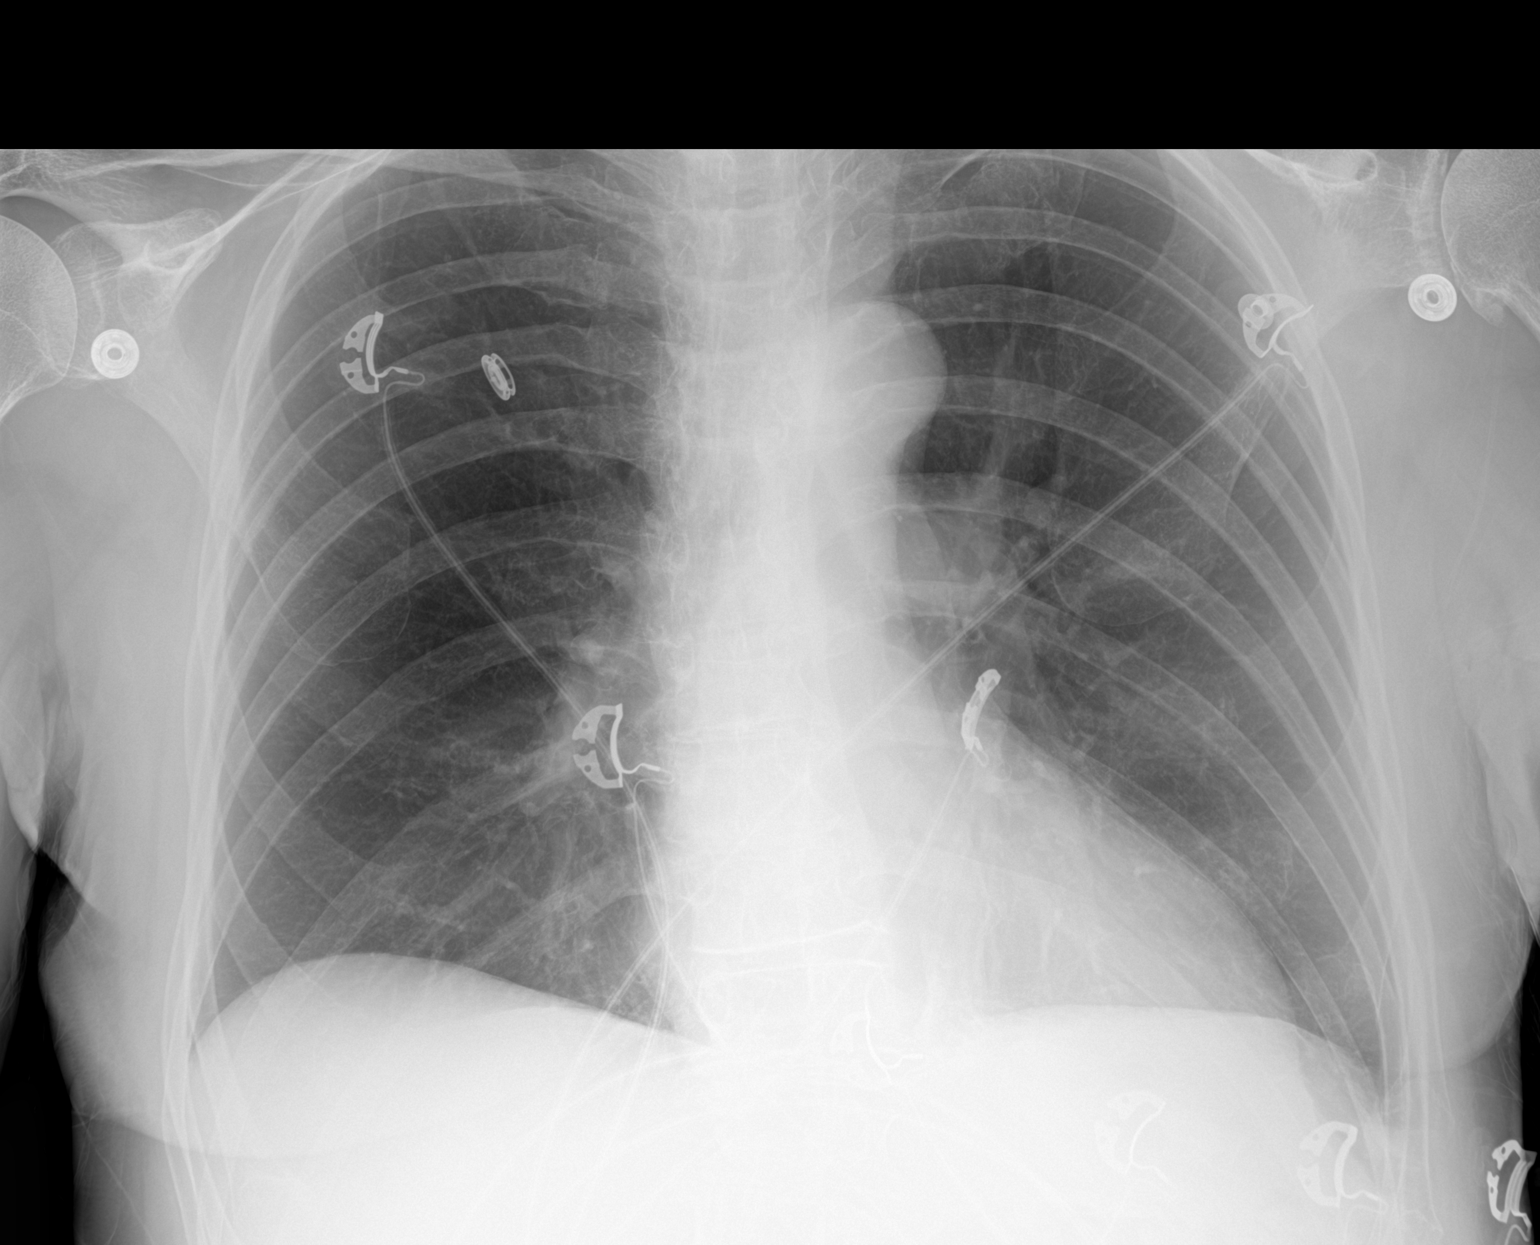

[1 of 1 positions shown; findings below may reference images not displayed]

FINDINGS: The heart size and mediastinal contours are within normal limits.
Both lungs are clear. The visualized skeletal structures are
unremarkable.
IMPRESSION: No acute abnormality of the lungs in AP portable projection

## 2023-05-18 ENCOUNTER — Telehealth: Payer: Self-pay | Admitting: Internal Medicine

## 2023-05-18 DIAGNOSIS — Z8679 Personal history of other diseases of the circulatory system: Secondary | ICD-10-CM

## 2023-05-18 NOTE — Telephone Encounter (Signed)
*  STAT* If patient is at the pharmacy, call can be transferred to refill team.   1. Which medications need to be refilled? (please list name of each medication and dose if known) metoprolol succinate (TOPROL-XL) 25 MG 24 hr tablet  2. Which pharmacy/location (including street and city if local pharmacy) is medication to be sent to? [facility] - [address] [place], [place], [place] [address]  3. Do they need a 30 day or 90 day supply?  30 day supply  Patient states he is traveling and forgot to pack his Metoprolol.

## 2023-05-28 ENCOUNTER — Ambulatory Visit: Payer: BC Managed Care – PPO | Admitting: Internal Medicine

## 2023-07-13 ENCOUNTER — Encounter: Payer: Self-pay | Admitting: Internal Medicine

## 2023-07-13 ENCOUNTER — Ambulatory Visit: Payer: Medicare PPO | Attending: Internal Medicine | Admitting: Internal Medicine

## 2023-07-13 VITALS — BP 130/76 | HR 59 | Ht 69.0 in | Wt 164.4 lb

## 2023-07-13 DIAGNOSIS — Z136 Encounter for screening for cardiovascular disorders: Secondary | ICD-10-CM

## 2023-07-13 DIAGNOSIS — I480 Paroxysmal atrial fibrillation: Secondary | ICD-10-CM | POA: Diagnosis not present

## 2023-07-13 NOTE — Patient Instructions (Addendum)
 Medication Instructions:  Your physician recommends that you continue on your current medications as directed. Please refer to the Current Medication list given to you today.  *If you need a refill on your cardiac medications before your next appointment, please call your pharmacy*  Lab Work: None  Follow-Up: At [facility], you and your health needs are our priority.  As part of our continuing mission to provide you with exceptional heart care, we have created designated Provider Care Teams.  These Care Teams include your primary Cardiologist (physician) and Advanced Practice Providers (APPs -  Physician Assistants and Nurse Practitioners) who all work together to provide you with the care you need, when you need it.  Your next appointment:   6 month(s)  Provider:   APP (Nurse Practioner or Surveyor, Mining)

## 2023-07-13 NOTE — Progress Notes (Signed)
 Cardiology Office Note:    Date:  07/13/2023   ID:  Kenneth Romero, DOB 05/11/1951, MRN [id number]  PCP:  Kenneth Norleen BROCKS, MD   [facility] Providers Cardiologist:  Alvan Ronal BRAVO, MD     Referring MD: Kenneth Norleen BROCKS, MD   No chief complaint on file.  Atrial Fibrillation  History of Present Illness:    Kenneth Romero is a 73 y.o. male with a hx of COVID19,  anal intraepithelial neoplasm, Glaucoma,seen in the ED for irregular heart rhythm, COVID+, initial  referral for c/f atrial fibrillation  Patient recently had COVID, saw his primary provider and noted some irregular heart beats. He was started on metoprolol  25 mg daily and eliquis . No EKG has shown atrial fibrillation. No history of bleeding. He noted that afib was seen on tele. Not captured on 12 leads or media strips I've seen. He notes for 30 seconds for a minute he notes palpitations. Once he got COVID , palpitations were happening at night. The palpitations kept him from sleeping prior to going to the ED. He then went to the ER with concern. It lasted all night. IV metop push was given. His troponin was negative. He has no hx of CVD. Normal renal function. He denies angina,  some dyspnea on exertion, lower extremity edema, PND or orthopnea.  He does tire with stairs lately. No hx of OSA. TSH 4.3  Age 62 No hypertension No DMII No stroke hx No MI, No PAD No heart failure  Social hx: Smoked when he was young stopped at 77. He is a horticulturist, commercial at [employer]. Very healthy, good diet, exercise and yoga.  Interim 05/27/2021: Mr. Rotan has since stopped eliquis  after 30 days as a CHADSCVASC of 1. He is taking ASA 81 mg daily. Today he states he is doing well. Blood pressures are in good control. Has no signs of sleep apnea. He is able to do his daily activities. He feels much better since COVID. 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  Interim Hx 11/11/2021: He feels well.  No changes in is health. He  plans to go to [place] to play the piano . He also will be traveling to the [place] region and [place].   Interim Hx 05/19/2022 No palpitations recently. Dilt Prn with prior symptoms over the summer.  Interval Hx 07/13/2023 He had heart racing around Thanksgiving and took diltiazem . He has doing well ever since.  He is still taking his BB.  He retired from his [employer] job.  He now will intermittently go to [employer].   Past Medical History:  Diagnosis Date   Anal intraepithelial neoplasia I (AIN I)    Vitiligo     Past Surgical History:  Procedure Laterality Date   ANUS SURGERY     COLONOSCOPY  07/07/2004   DR.HUNG   SIGMOIDOSCOPY  07/07/2008   DR.HUNG    Current Medications: Current Meds  Medication Sig   acetaminophen  (TYLENOL ) 325 MG tablet Take 2 tablets (650 mg total) by mouth every 6 (six) hours as needed for headache.   Ascorbic Acid (VITAMIN C) 1000 MG tablet Take 1,000 mg by mouth 3 (three) times daily.   fish oil-omega-3 fatty acids  1000 MG capsule Take 1 g by mouth daily.   Garlic 100 MG TABS Take 1 tablet by mouth 1 day or 1 dose.   metoprolol  succinate (TOPROL -XL) 25 MG 24 hr tablet Take 1 tablet (25 mg total) by mouth daily.  milk thistle 175 MG tablet Take 175 mg by mouth daily.   Multiple Vitamins-Minerals (MULTIVITAMIN WITH MINERALS) tablet Take 1 tablet by mouth daily.   VYZULTA  0.024 % SOLN Place 1 drop into both eyes at bedtime.     Allergies:   Sulfa antibiotics   Social History   Socioeconomic History   Marital status: Single    Spouse name: Not on file   Number of children: Not on file   Years of education: Not on file   Highest education level: Not on file  Occupational History   Not on file  Tobacco Use   Smoking status: Former    Current packs/day: 0.00    Types: Cigarettes    Quit date: 07/07/1972    Years since quitting: 51.0   Smokeless tobacco: Never  Vaping Use   Vaping status: Never Used  Substance and Sexual Activity   Alcohol use:  Yes    Alcohol/week: 3.0 standard drinks of alcohol    Types: 3 Glasses of wine per week    Comment: or less   Drug use: No   Sexual activity: Not Currently  Other Topics Concern   Not on file  Social History Narrative   Not on file   Social Drivers of Health   Financial Resource Strain: Low Risk  (12/16/2022)   Overall Financial Resource Strain (CARDIA)    Difficulty of Paying Living Expenses: Not very hard  Food Insecurity: No Food Insecurity (12/16/2022)   Hunger Vital Sign    Worried About Running Out of Food in the Last Year: Never true    Ran Out of Food in the Last Year: Never true  Transportation Needs: No Transportation Needs (12/16/2022)   PRAPARE - Administrator, Civil Service (Medical): No    Lack of Transportation (Non-Medical): No  Physical Activity: Not on file  Stress: No Stress Concern Present (12/16/2022)   [employer] of Occupational Health - Occupational Stress Questionnaire    Feeling of Stress : Not at all  Social Connections: Not on file     Family History: The patient's father pulmonary fibrosis- deceased. Mother- psychiatric disorders  ROS:   Please see the history of present illness.     All other systems reviewed and are negative.  EKGs/Labs/Other Studies Reviewed:    The following studies were reviewed today:   EKG:  EKG is  ordered today.  The ekg ordered today demonstrates   04/17/2021-NSR  11/11/2021-NSR  05/19/2022- NS with sinus arrhythmia  EKG Interpretation Date/Time:  Monday July 13 2023 13:25:25 EST Ventricular Rate:  59 PR Interval:  166 QRS Duration:  78 QT Interval:  390 QTC Calculation: 386 R Axis:   -24  Text Interpretation: Sinus bradycardia When compared with ECG of 30-Mar-2021 16:01, PREVIOUS ECG IS PRESENT Confirmed by Alvan Shuck ([id number]) on 07/13/2023 1:28:29 PM   CHA2DS2-VASc Score = 1   This indicates a 0.6% annual risk of stroke. The patient's score is based upon: CHF History: 0 HTN  History: 0 Diabetes History: 0 Stroke History: 0 Vascular Disease History: 0 Age Score: 1 Gender Score: 0       Cardiology Studies 04/23/2021- Predominantly NSR. 8% Atrial fibrillation burden.  05/10/2021- Normal LV/RV function. Mild AI. No signiificant valve dx or pulm htn  Recent Labs: 12/16/2022: ALT 18; BUN 11; Creatinine, Ser 0.62; Hemoglobin 14.9; Platelets 229; Potassium 4.4; Sodium 134   Recent Lipid Panel    Component Value Date/Time   CHOL 176 12/16/2022 1430  TRIG 67 12/16/2022 1430   HDL 71 12/16/2022 1430   CHOLHDL 2.5 12/16/2022 1430   CHOLHDL 3.0 12/26/2014 0001   VLDL 19 12/26/2014 0001   LDLCALC 92 12/16/2022 1430     Risk Assessment/Calculations:    CHA2DS2-VASc Score = 1   This indicates a 0.6% annual risk of stroke. The patient's score is based upon: CHF History: 0 HTN History: 0 Diabetes History: 0 Stroke History: 0 Vascular Disease History: 0 Age Score: 1 Gender Score: 0    CHADS2VASC = 1  The 10-year ASCVD risk score (Arnett DK, et al., 2019) is: 20.4%   Values used to calculate the score:     Age: 36 years     Sex: Male     Is Non-Hispanic African American: No     Diabetic: No     Tobacco smoker: No     Systolic Blood Pressure: 130 mmHg     Is BP treated: No     HDL Cholesterol: 71 mg/dL     Total Cholesterol: 176 mg/dL        Physical Exam:    VS:    Vitals:   07/13/23 1314  BP: 130/76  Pulse: (!) 59  SpO2: 95%      Wt Readings from Last 3 Encounters:  07/13/23 164 lb 6.4 oz (74.6 kg)  12/16/22 165 lb (74.8 kg)  05/19/22 167 lb 9.6 oz (76 kg)     GEN:  Well nourished, well developed in no acute distress HEENT: Normal NECK: No JVD;  CARDIAC: RRR, no murmurs, rubs, gallops Vasc: 2+ BL radial RESPIRATORY:  Nl wob, Clear to auscultation ABDOMEN: Soft, non-tender, non-distended MUSCULOSKELETAL:  No edema; No deformity  SKIN: Warm and dry NEUROLOGIC:  Alert and oriented x 3 PSYCHIATRIC:  Normal affect    ASSESSMENT:    #Paroxysmal Atrial Fibrillation: Chads2vasc = 1 (Age) In the ED 04/22/2021 it was noted that he developed atrial fibrillation. He was initially on eliquis  in the event that he would require DCCV.  With low CHA2DS2-VASc there is no strong indication for anticoagulation.  Will continue rate control strategy for now, unless AF becomes persistent. - in sinus today - continue metoprolol  25 mg daily - continue dilt 30 mg 4 times daily PRN - off aspirin  with GI issues   Elevated ASCVD We discussed that mainly based on his age his ASCVD risk is elevated.  However beyond this he does not have any significant risk factors for coronary artery disease.  He would prefer to limit medications unless they are necessary; so we discussed that if his risk factors change that we will reconsider statin therapy at that time.  PLAN:    In order of problems listed above:  No changes today Follow up with an APP in 6 months     Medication Adjustments/Labs and Tests Ordered: Current medicines are reviewed at length with the patient today.  Concerns regarding medicines are outlined above.    Signed, Alvan Ronal BRAVO, MD  07/13/2023 1:39 PM    [facility]

## 2023-07-26 ENCOUNTER — Other Ambulatory Visit: Payer: Self-pay | Admitting: Internal Medicine

## 2023-07-26 DIAGNOSIS — Z8679 Personal history of other diseases of the circulatory system: Secondary | ICD-10-CM

## 2023-09-01 ENCOUNTER — Encounter: Payer: Self-pay | Admitting: Internal Medicine

## 2023-11-12 ENCOUNTER — Ambulatory Visit
Admission: RE | Admit: 2023-11-12 | Discharge: 2023-11-12 | Disposition: A | Discharge status: Home / Self Care | Source: Ambulatory Visit | Attending: Family Medicine | Admitting: Family Medicine

## 2023-11-12 ENCOUNTER — Ambulatory Visit: Admitting: Family Medicine

## 2023-11-12 VITALS — BP 118/76 | HR 73 | Wt 164.8 lb

## 2023-11-12 DIAGNOSIS — M25552 Pain in left hip: Secondary | ICD-10-CM | POA: Diagnosis not present

## 2023-11-12 DIAGNOSIS — M25561 Pain in right knee: Secondary | ICD-10-CM

## 2023-11-12 NOTE — Progress Notes (Signed)
   Subjective:    Patient ID: Kenneth Romero, male    DOB: Aug 29, 1950, 73 y.o.   MRN: [id number]  HPI He complains of a several month history of left hip discomfort especially when he is out walking.  This has been getting worse and now interfering with some of his ADLs.  He also been complaining of some right knee discomfort especially a grinding sensation when he goes up steps.  No popping locking    Review of Systems     Objective:    Physical Exam Exam of the left hip shows good motion of the hip.  No tenderness over the greater trochanter.  Slight discomfort in the anterolateral hip area as well as posterolateral hip not near the SI joint. Exam of the right knee shows no effusion.  No crepitus noted.  Compression testing negative.  Ligaments intact.      Assessment & Plan:  Left hip pain - Plan: DG Hip Unilat W OR W/O Pelvis 2-3 Views Left  Acute pain of right knee - Plan: DG Knee Complete 4 Views Right Unclear as to what is causing all these types of problems and I will get x-rays and move on from there.

## 2023-11-18 ENCOUNTER — Ambulatory Visit: Payer: Self-pay | Admitting: Family Medicine

## 2023-11-18 DIAGNOSIS — M8580 Other specified disorders of bone density and structure, unspecified site: Secondary | ICD-10-CM

## 2023-11-20 ENCOUNTER — Telehealth: Payer: Self-pay | Admitting: Internal Medicine

## 2023-11-20 NOTE — Telephone Encounter (Signed)
 Sent mychart message with [facility] number for him to call to schedule dexa  Copied from CRM [id number]. Topic: Clinical - Request for Lab/Test Order >> Nov 20, 2023  2:40 PM Yolanda T wrote: Reason for CRM: patient called to see where he would go to get his DEXA imaging done and if it was ordered. Please f/u with patient

## 2023-12-16 ENCOUNTER — Ambulatory Visit: Payer: Self-pay | Admitting: Family Medicine

## 2023-12-16 ENCOUNTER — Ambulatory Visit (HOSPITAL_BASED_OUTPATIENT_CLINIC_OR_DEPARTMENT_OTHER)
Admission: RE | Admit: 2023-12-16 | Discharge: 2023-12-16 | Disposition: A | Discharge status: Home / Self Care | Source: Ambulatory Visit | Attending: Family Medicine | Admitting: Family Medicine

## 2023-12-16 DIAGNOSIS — Z13820 Encounter for screening for osteoporosis: Secondary | ICD-10-CM | POA: Diagnosis present

## 2023-12-16 DIAGNOSIS — M8580 Other specified disorders of bone density and structure, unspecified site: Secondary | ICD-10-CM

## 2023-12-16 DIAGNOSIS — M810 Age-related osteoporosis without current pathological fracture: Secondary | ICD-10-CM | POA: Insufficient documentation

## 2023-12-16 NOTE — Progress Notes (Signed)
 Done

## 2023-12-22 ENCOUNTER — Encounter: Payer: Self-pay | Admitting: Family Medicine

## 2023-12-22 ENCOUNTER — Encounter: Admitting: Family Medicine

## 2023-12-22 ENCOUNTER — Ambulatory Visit: Payer: BC Managed Care – PPO | Admitting: Family Medicine

## 2023-12-22 VITALS — BP 120/68 | HR 68 | Ht 70.0 in | Wt 162.8 lb

## 2023-12-22 DIAGNOSIS — R3915 Urgency of urination: Secondary | ICD-10-CM

## 2023-12-22 DIAGNOSIS — M816 Localized osteoporosis [Lequesne]: Secondary | ICD-10-CM

## 2023-12-22 DIAGNOSIS — H9313 Tinnitus, bilateral: Secondary | ICD-10-CM

## 2023-12-22 DIAGNOSIS — Z23 Encounter for immunization: Secondary | ICD-10-CM | POA: Diagnosis not present

## 2023-12-22 DIAGNOSIS — Z1321 Encounter for screening for nutritional disorder: Secondary | ICD-10-CM

## 2023-12-22 DIAGNOSIS — H4010X2 Unspecified open-angle glaucoma, moderate stage: Secondary | ICD-10-CM | POA: Diagnosis not present

## 2023-12-22 DIAGNOSIS — Z8679 Personal history of other diseases of the circulatory system: Secondary | ICD-10-CM | POA: Diagnosis not present

## 2023-12-22 DIAGNOSIS — K6282 Dysplasia of anus: Secondary | ICD-10-CM

## 2023-12-22 DIAGNOSIS — Z0000 Encounter for general adult medical examination without abnormal findings: Secondary | ICD-10-CM

## 2023-12-22 LAB — LIPID PANEL

## 2023-12-22 MED ORDER — IBANDRONATE SODIUM 150 MG PO TABS: 150.0000 mg | ORAL_TABLET | ORAL | 3 refills | Status: AC

## 2023-12-22 MED ORDER — METOPROLOL SUCCINATE ER 25 MG PO TB24: 25.0000 mg | ORAL_TABLET | Freq: Every day | ORAL | 3 refills | Status: AC

## 2023-12-22 NOTE — Patient Instructions (Signed)
Kegel Exercises  Kegel exercises can help strengthen your pelvic floor muscles. The pelvic floor is a group of muscles that support your rectum, small intestine, and bladder. In females, pelvic floor muscles also help support the uterus. These muscles help you control the flow of urine and stool (feces). Kegel exercises are painless and simple. They do not require any equipment. Your provider may suggest Kegel exercises to: Improve bladder and bowel control. Improve sexual response. Improve weak pelvic floor muscles after surgery to remove the uterus (hysterectomy) or after pregnancy, in females. Improve weak pelvic floor muscles after prostate gland removal or surgery, in males. Kegel exercises involve squeezing your pelvic floor muscles. These are the same muscles you squeeze when you try to stop the flow of urine or keep from passing gas. The exercises can be done while sitting, standing, or lying down, but it is best to vary your position. Ask your health care provider which exercises are safe for you. Do exercises exactly as told by your health care provider and adjust them as directed. Do not begin these exercises until told by your health care provider. Exercises How to do Kegel exercises: Squeeze your pelvic floor muscles tight. You should feel a tight lift in your rectal area. If you are a male, you should also feel a tightness in your vaginal area. Keep your stomach, buttocks, and legs relaxed. Hold the muscles tight for up to 10 seconds. Breathe normally. Relax your muscles for up to 10 seconds. Repeat as told by your health care provider. Repeat this exercise daily as told by your health care provider. Continue to do this exercise for at least 4-6 weeks, or for as long as told by your health care provider. You may be referred to a physical therapist who can help you learn more about how to do Kegel exercises. Depending on your condition, your health care provider may  recommend: Varying how long you squeeze your muscles. Doing several sets of exercises every day. Doing exercises for several weeks. Making Kegel exercises a part of your regular exercise routine. This information is not intended to replace advice given to you by your health care provider. Make sure you discuss any questions you have with your health care provider. Document Revised: 11/01/2020 Document Reviewed: 11/01/2020 Elsevier Patient Education  2024 Elsevier Inc.  

## 2023-12-22 NOTE — Progress Notes (Addendum)
 Kenneth Romero is a 73 y.o. male who presents for annual wellness visit and follow-up on chronic medical conditions.  He recently had a DEXA scan done which did show evidence of osteoporosis.  He also was recently seen by GI for his AIN and apparently everything was clear.  He is having some difficulty with urinary urgency.  He does have a previous history of atrial fibs and does plan to see cardiology in the near future.  He follows up regularly with ophthalmology for his underlying glaucoma.  He is retired and enjoying his retirement and does exercise fairly regularly.  He states that he did have the RSV immunization.   Immunizations and Health Maintenance Immunization History  Administered Date(s) Administered   Fluad Quad(high Dose 65+) 06/04/2020, 05/09/2022   Fluad Trivalent(High Dose 65+) 03/31/2023   Hepatitis A 06/07/2001, 07/23/2001   Hepatitis B 06/07/2001, 07/23/2001, 01/18/2002   Influenza Split 05/07/2012   Influenza, High Dose Seasonal PF 04/18/2016, 08/06/2017, 06/04/2018   Influenza-Unspecified 06/04/2018, 04/20/2019, 03/15/2021   PFIZER Comirnaty(Gray Top)Covid-19 Tri-Sucrose Vaccine 10/15/2020   PFIZER(Purple Top)SARS-COV-2 Vaccination 08/18/2019, 09/12/2019, 04/02/2020   Pfizer Covid-19 Vaccine Bivalent Booster 74yrs & up 03/15/2021   Pfizer(Comirnaty)Fall Seasonal Vaccine 12 years and older 03/31/2023   Pneumococcal Conjugate-13 12/26/2014   Pneumococcal Polysaccharide-23 07/08/1999   Td 08/01/1993, 11/16/2003   Tdap 11/16/2003, 11/04/2013, 12/16/2022   Zoster Recombinant(Shingrix ) 08/06/2017, 11/03/2017   Zoster, Live 08/13/2009   Health Maintenance Due  Topic Date Due   Medicare Annual Wellness (AWV)  Never done   COVID-19 Vaccine (7 - 2024-25 season) 09/28/2023    Last colonoscopy: 5 years ago, Dr. Alvis Jourdain Last PSA: today Dentist: Dr. Alysia Jumbo Ophtho: Ben Bracken Exercise: 2x a week for 1 hour  Other doctors caring for patient include: Dr.  [facility]  Advanced Directives:yes copy asked for Does Patient Have a Medical Advance Directive?: Yes Type of Advance Directive: Healthcare Power of Attorney Copy of Healthcare Power of Attorney in Chart?: No - copy requested  Depression screen:  See questionnaire below.        12/22/2023    8:31 AM 12/16/2022    1:44 PM 11/06/2021    2:29 PM 10/15/2020    8:46 AM 10/15/2020    8:44 AM  Depression screen PHQ 2/9  Decreased Interest 0 0 0 0 0  Down, Depressed, Hopeless 0 0 0 0 0  PHQ - 2 Score 0 0 0 0 0  Altered sleeping   0 3   Tired, decreased energy   0 3   Change in appetite   0 0   Feeling bad or failure about yourself    0 0   Trouble concentrating   0 0   Moving slowly or fidgety/restless   0 0   Suicidal thoughts   0 0   PHQ-9 Score   0 6   Difficult doing work/chores   Not difficult at all Not difficult at all    The 10-year ASCVD risk score (Arnett DK, et al., 2019) is: 18.4%   Values used to calculate the score:     Age: 67 years     Clincally relevant sex: Male     Is Non-Hispanic African American: No     Diabetic: No     Tobacco smoker: No     Systolic Blood Pressure: 120 mmHg     Is BP treated: No     HDL Cholesterol: 70 mg/dL     Total  Cholesterol: 186 mg/dL  Fall Screen: See Questionaire below.      12/22/2023    8:31 AM 12/16/2022    1:44 PM 11/06/2021    2:30 PM 10/15/2020    8:44 AM 06/04/2020    9:44 AM  Fall Risk   Falls in the past year? 0 0 0 1 1  Number falls in past yr: 0 0 0 0 0  Injury with Fall? 0 0 0 0 0  Risk for fall due to : No Fall Risks No Fall Risks No Fall Risks Impaired mobility   Risk for fall due to: Comment    foot went to sleep   Follow up Falls evaluation completed Falls evaluation completed Falls evaluation completed  Falls evaluation completed;Education provided       Data saved with a previous flowsheet row definition    ADL screen:  See questionnaire below.  Functional Status Survey: Is the patient  deaf or have difficulty hearing?: Yes Does the patient have difficulty seeing, even when wearing glasses/contacts?: No Does the patient have difficulty concentrating, remembering, or making decisions?: No Does the patient have difficulty walking or climbing stairs?: No Does the patient have difficulty dressing or bathing?: No Does the patient have difficulty doing errands alone such as visiting a doctor's office or shopping?: No   Review of Systems  Constitutional: -, -unexpected weight change, -anorexia, -fatigue Allergy: -sneezing, -itching, -congestion Dermatology: denies changing moles, rash, lumps ENT: -runny nose, -ear pain, -sore throat,  Cardiology:  -chest pain, -palpitations, -orthopnea, Respiratory: -cough, -shortness of breath, -dyspnea on exertion, -wheezing,  Gastroenterology: -abdominal pain, -nausea, -vomiting, -diarrhea, -constipation, -dysphagia Hematology: -bleeding or bruising problems Musculoskeletal: -arthralgias, -myalgias, -joint swelling, -back pain, - Ophthalmology: -vision changes,  Urology: -dysuria, -difficulty urinating,  -urinary frequency, incontinence Neurology: -, -numbness, , -memory loss, -falls, -dizziness    PHYSICAL EXAM:   General Appearance: Alert, cooperative, no distress, appears stated age Head: Normocephalic, without obvious abnormality, atraumatic Eyes: PERRL, conjunctiva/corneas clear, EOM's intact,  Ears: Normal TM's and external ear canals Nose: Nares normal, mucosa normal, no drainage or sinus   tenderness Throat: Lips, mucosa, and tongue normal; teeth and gums normal Neck: Supple, no lymphadenopathy, thyroid :no enlargement/tenderness/nodules; no carotid bruit or JVD Lungs: Clear to auscultation bilaterally without wheezes, rales or ronchi; respirations unlabored Heart: Regular rate and rhythm, S1 and S2 normal, no murmur, rub or gallop Abdomen: Soft, non-tender, nondistended, normoactive bowel sounds, no masses, no  hepatosplenomegaly Skin: Skin color, texture, turgor normal, no rashes or lesions Lymph nodes: Cervical, supraclavicular, and axillary nodes normal Neurologic: CNII-XII intact, normal strength, sensation and gait; reflexes 2+ and symmetric throughout   Psych: Normal mood, affect, hygiene and grooming  ASSESSMENT/PLAN: Routine general medical examination at a health care facility  History of atrial fibrillation - Plan: CBC with Differential/Platelet, Comprehensive metabolic panel with GFR, Lipid panel, metoprolol  succinate (TOPROL -XL) 25 MG 24 hr tablet  Open-angle glaucoma of both eyes, moderate stage, unspecified open-angle glaucoma type  Anal intraepithelial neoplasia I (AIN I)  Need for vaccination against Streptococcus pneumoniae - Plan: Pneumococcal conjugate vaccine 20-valent (Prevnar 20)  Urgency of urination - Plan: PSA  Localized osteoporosis without current pathological fracture    Discussed PSA screening (risks/benefits), recommended at least 30 minutes of aerobic activity at least 5 days/week;  Immunization recommendations discussed.  Colonoscopy recommendations reviewed.  Discussed the proper use of Boniva on an empty stomach with plenty of fluids in the upright position.  Also discussed risk for fractures because of the osteoporosis.  Medicare Attestation I have personally reviewed: The patient's medical and social history Their use of alcohol, tobacco or illicit drugs Their current medications and supplements The patient's functional ability including ADLs,fall risks, home safety risks, cognitive, and hearing and visual impairment Diet and physical activities Evidence for depression or mood disorders  The patient's weight, height, and BMI have been recorded in the chart.  I have made referrals, counseling, and provided education to the patient based on review of the above and I have provided the patient with a written personalized care plan for preventive services.      Ron Cobbs, MD   12/22/2023

## 2023-12-23 ENCOUNTER — Encounter: Admitting: Family Medicine

## 2023-12-23 ENCOUNTER — Ambulatory Visit: Payer: Self-pay | Admitting: Family Medicine

## 2023-12-23 DIAGNOSIS — E782 Mixed hyperlipidemia: Secondary | ICD-10-CM

## 2023-12-23 LAB — CBC WITH DIFFERENTIAL/PLATELET
Basophils Absolute: 0 10*3/uL (ref 0.0–0.2)
Basos: 0 %
EOS (ABSOLUTE): 0.1 10*3/uL (ref 0.0–0.4)
Eos: 2 %
Hematocrit: 45.8 % (ref 37.5–51.0)
Hemoglobin: 15.4 g/dL (ref 13.0–17.7)
Immature Grans (Abs): 0 10*3/uL (ref 0.0–0.1)
Immature Granulocytes: 0 %
Lymphocytes Absolute: 1.4 10*3/uL (ref 0.7–3.1)
Lymphs: 28 %
MCH: 32.6 pg (ref 26.6–33.0)
MCHC: 33.6 g/dL (ref 31.5–35.7)
MCV: 97 fL (ref 79–97)
Monocytes Absolute: 0.6 10*3/uL (ref 0.1–0.9)
Monocytes: 11 %
Neutrophils Absolute: 2.9 10*3/uL (ref 1.4–7.0)
Neutrophils: 59 %
Platelets: 222 10*3/uL (ref 150–450)
RBC: 4.72 x10E6/uL (ref 4.14–5.80)
RDW: 12 % (ref 11.6–15.4)
WBC: 5 10*3/uL (ref 3.4–10.8)

## 2023-12-23 LAB — LIPID PANEL
Cholesterol, Total: 186 mg/dL (ref 100–199)
HDL: 70 mg/dL (ref >39)
LDL CALC COMMENT:: 2.7 ratio (ref 0.0–5.0)
LDL Chol Calc (NIH): 104 mg/dL — ABNORMAL HIGH (ref 0–99)
Triglycerides: 64 mg/dL (ref 0–149)
VLDL Cholesterol Cal: 12 mg/dL (ref 5–40)

## 2023-12-23 LAB — COMPREHENSIVE METABOLIC PANEL WITH GFR
ALT: 20 IU/L (ref 0–44)
AST: 26 IU/L (ref 0–40)
Albumin: 4.3 g/dL (ref 3.8–4.8)
Alkaline Phosphatase: 75 IU/L (ref 44–121)
BUN/Creatinine Ratio: 22 (ref 10–24)
BUN: 13 mg/dL (ref 8–27)
Bilirubin Total: 1.1 mg/dL (ref 0.0–1.2)
CO2: 21 mmol/L (ref 20–29)
Calcium: 9.6 mg/dL (ref 8.6–10.2)
Chloride: 97 mmol/L (ref 96–106)
Creatinine, Ser: 0.59 mg/dL — ABNORMAL LOW (ref 0.76–1.27)
Globulin, Total: 1.9 g/dL (ref 1.5–4.5)
Glucose: 94 mg/dL (ref 70–99)
Potassium: 4.7 mmol/L (ref 3.5–5.2)
Sodium: 133 mmol/L — ABNORMAL LOW (ref 134–144)
Total Protein: 6.2 g/dL (ref 6.0–8.5)
eGFR: 102 mL/min/{1.73_m2} (ref >59)

## 2023-12-23 LAB — VITAMIN D 25 HYDROXY (VIT D DEFICIENCY, FRACTURES): Vit D, 25-Hydroxy: 25.5 ng/mL — ABNORMAL LOW (ref 30.0–100.0)

## 2023-12-23 LAB — PSA: Prostate Specific Ag, Serum: 1 ng/mL (ref 0.0–4.0)

## 2023-12-23 MED ORDER — ATORVASTATIN CALCIUM 20 MG PO TABS: 20.0000 mg | ORAL_TABLET | Freq: Every day | ORAL | 3 refills | Status: AC

## 2023-12-23 NOTE — Addendum Note (Signed)
 Addended by: Watson Hacking on: 12/23/2023 05:03 PM   Modules accepted: Orders

## 2023-12-29 ENCOUNTER — Ambulatory Visit: Admitting: Family Medicine

## 2024-01-11 ENCOUNTER — Encounter: Payer: Self-pay | Admitting: Cardiovascular Disease

## 2024-01-11 ENCOUNTER — Ambulatory Visit: Attending: Cardiovascular Disease | Admitting: Cardiovascular Disease

## 2024-01-11 VITALS — BP 121/77 | HR 61 | Ht 69.0 in | Wt 164.4 lb

## 2024-01-11 DIAGNOSIS — E782 Mixed hyperlipidemia: Secondary | ICD-10-CM | POA: Diagnosis not present

## 2024-01-11 DIAGNOSIS — Z136 Encounter for screening for cardiovascular disorders: Secondary | ICD-10-CM

## 2024-01-11 DIAGNOSIS — R002 Palpitations: Secondary | ICD-10-CM | POA: Diagnosis not present

## 2024-01-11 DIAGNOSIS — Z8679 Personal history of other diseases of the circulatory system: Secondary | ICD-10-CM

## 2024-01-11 DIAGNOSIS — E785 Hyperlipidemia, unspecified: Secondary | ICD-10-CM | POA: Insufficient documentation

## 2024-01-11 NOTE — Assessment & Plan Note (Signed)
 History of PAF which started after COVID in 2022.  He did wear a monitor for 2 weeks that showed runs of A-fib with a percent burden.  He has CT score was 1 and therefore he has not been on Eliquis  or anticoagulation.  He has as needed Cardizem  which she is taken half a dozen times in the last 3 years.  He does know when he is in A-fib.  He is in sinus rhythm today.

## 2024-01-11 NOTE — Assessment & Plan Note (Signed)
 History of mild hyperlipidemia recently started on statin therapy by his PCP with lipid profile performed 12/22/2023 revealing total cholesterol 186, LDL 104 and HDL 70.

## 2024-01-11 NOTE — Progress Notes (Signed)
 01/11/2024 Kenneth Romero   April 11, 1951  [id number]  Primary Physician Joyce Norleen BROCKS, MD Primary Cardiologist: Dorn JINNY Lesches MD [place] [place], [place]  HPI:  Kenneth Romero is a 73 y.o. 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  He was formally a patient of Dr. Ronal branches.  I am assuming his care in her absence.  His only cardiac risk factor is recently treated hyperlipidemia.  There is no family history of heart disease.  He is never had a heart attack or stroke.  He denies chest pain or shortness of breath.  He does walk, do cardio and weights at the [facility] recreation center.  His CV risk was 1 and therefore was not started on oral anticoagulation.  He uses as needed Cardizem  for A-fib which he is only had to use a half a dozen times in the last several years.   Current Meds  Medication Sig   acetaminophen  (TYLENOL ) 325 MG tablet Take 2 tablets (650 mg total) by mouth every 6 (six) hours as needed for headache.   Ascorbic Acid (VITAMIN C) 1000 MG tablet Take 1,000 mg by mouth 3 (three) times daily.   atorvastatin  (LIPITOR) 20 MG tablet Take 1 tablet (20 mg total) by mouth daily.   fish oil-omega-3 fatty acids  1000 MG capsule Take 1 g by mouth daily.   Garlic 100 MG TABS Take 1 tablet by mouth 1 day or 1 dose.   ibandronate  (BONIVA ) 150 MG tablet Take 1 tablet (150 mg total) by mouth every 30 (thirty) days. Take in the morning with a full glass of water, on an empty stomach, and do not take anything else by mouth or lie down for the next 30 min.   metoprolol  succinate (TOPROL -XL) 25 MG 24 hr tablet Take 1 tablet (25 mg total) by mouth daily.   milk thistle 175 MG tablet Take 175 mg by mouth daily.   Multiple Vitamins-Minerals (MULTIVITAMIN WITH MINERALS) tablet Take 1 tablet by mouth daily.   VYZULTA  0.024 % SOLN Place 1 drop into both eyes at bedtime.     Allergies  Allergen Reactions    Sulfa Antibiotics Other (See Comments)    Unknown (childhood)    Social History   Socioeconomic History   Marital status: Single    Spouse name: Not on file   Number of children: Not on file   Years of education: Not on file   Highest education level: Doctorate  Occupational History   Not on file  Tobacco Use   Smoking status: Former    Current packs/day: 0.00    Types: Cigarettes    Quit date: 07/07/1972    Years since quitting: 51.5   Smokeless tobacco: Never  Vaping Use   Vaping status: Never Used  Substance and Sexual Activity   Alcohol use: Yes    Alcohol/week: 3.0 standard drinks of alcohol    Types: 3 Glasses of wine per week    Comment: or less   Drug use: No   Sexual activity: Not Currently  Other Topics Concern   Not on file  Social History Narrative   Not on file   Social Drivers of Health   Financial Resource Strain: Low Risk  (12/21/2023)   Overall Financial Resource Strain (CARDIA)    Difficulty of Paying Living Expenses: Not hard at all  Food Insecurity: No Food Insecurity (12/21/2023)   Hunger  Vital Sign    Worried About Programme researcher, broadcasting/film/video in the Last Year: Never true    Ran Out of Food in the Last Year: Never true  Transportation Needs: No Transportation Needs (12/21/2023)   PRAPARE - Administrator, Civil Service (Medical): No    Lack of Transportation (Non-Medical): No  Physical Activity: Sufficiently Active (12/21/2023)   Exercise Vital Sign    Days of Exercise per Week: 3 days    Minutes of Exercise per Session: 50 min  Stress: No Stress Concern Present (12/21/2023)   [employer] of Occupational Health - Occupational Stress Questionnaire    Feeling of Stress: Not at all  Social Connections: Socially Isolated (12/21/2023)   Social Connection and Isolation Panel    Frequency of Communication with Friends and Family: Once a week    Frequency of Social Gatherings with Friends and Family: Once a week    Attends Religious  Services: Never    Database administrator or Organizations: No    Attends Engineer, structural: Not on file    Marital Status: Never married  Intimate Partner Violence: Not At Risk (12/16/2022)   Humiliation, Afraid, Rape, and Kick questionnaire    Fear of Current or Ex-Partner: No    Emotionally Abused: No    Physically Abused: No    Sexually Abused: No     Review of Systems: General: negative for chills, fever, night sweats or weight changes.  Cardiovascular: negative for chest pain, dyspnea on exertion, edema, orthopnea, palpitations, paroxysmal nocturnal dyspnea or shortness of breath Dermatological: negative for rash Respiratory: negative for cough or wheezing Urologic: negative for hematuria Abdominal: negative for nausea, vomiting, diarrhea, bright red blood per rectum, melena, or hematemesis Neurologic: negative for visual changes, syncope, or dizziness All other systems reviewed and are otherwise negative except as noted above.    Blood pressure 121/77, pulse 61, height 5' 9 (1.753 m), weight 164 lb 6.4 oz (74.6 kg), SpO2 97%.  General appearance: alert and no distress Neck: no adenopathy, no carotid bruit, no JVD, supple, symmetrical, trachea midline, and thyroid  not enlarged, symmetric, no tenderness/mass/nodules Lungs: clear to auscultation bilaterally Heart: regular rate and rhythm, S1, S2 normal, no murmur, click, rub or gallop Extremities: extremities normal, atraumatic, no cyanosis or edema Pulses: 2+ and symmetric Skin: Skin color, texture, turgor normal. No rashes or lesions Neurologic: Grossly normal  EKG EKG Interpretation Date/Time:  Monday January 11 2024 09:36:21 EDT Ventricular Rate:  61 PR Interval:  172 QRS Duration:  80 QT Interval:  392 QTC Calculation: 394 R Axis:   95  Text Interpretation: Sinus rhythm with Premature atrial complexes Rightward axis When compared with ECG of 13-Jul-2023 13:25, Premature atrial complexes are now Present  Questionable change in QRS axis T wave inversion now evident in Inferior leads Confirmed by Court Carrier [id number]) on 01/11/2024 9:56:50 AM    ASSESSMENT AND PLAN:   History of atrial fibrillation History of PAF which started after COVID in 2022.  He did wear a monitor for 2 weeks that showed runs of A-fib with a percent burden.  He has CT score was 1 and therefore he has not been on Eliquis  or anticoagulation.  He has as needed Cardizem  which she is taken half a dozen times in the last 3 years.  He does know when he is in A-fib.  He is in sinus rhythm today.  Hyperlipidemia History of mild hyperlipidemia recently started on statin therapy by his PCP with  lipid profile performed 12/22/2023 revealing total cholesterol 186, LDL 104 and HDL 70.     Dorn DOROTHA Lesches MD FACP,FACC,FAHA, [facility] 01/11/2024 10:12 AM

## 2024-01-11 NOTE — Patient Instructions (Signed)

## 2024-01-21 DIAGNOSIS — H401231 Low-tension glaucoma, bilateral, mild stage: Secondary | ICD-10-CM | POA: Diagnosis not present

## 2024-01-21 DIAGNOSIS — H2513 Age-related nuclear cataract, bilateral: Secondary | ICD-10-CM | POA: Diagnosis not present

## 2024-01-21 DIAGNOSIS — H47233 Glaucomatous optic atrophy, bilateral: Secondary | ICD-10-CM | POA: Diagnosis not present

## 2024-03-14 ENCOUNTER — Telehealth: Payer: Self-pay

## 2024-03-14 NOTE — Telephone Encounter (Signed)
 Patient can come pick up rx

## 2024-03-14 NOTE — Telephone Encounter (Signed)
 Copied from CRM [id number]. Topic: Clinical - Medical Advice >> Mar 14, 2024  1:00 PM Thliyah D wrote: Pt wants to know if he can get the covid vaccine at the office

## 2024-03-14 NOTE — Telephone Encounter (Signed)
 Called Pt to pick up.

## 2024-03-22 ENCOUNTER — Encounter: Payer: Self-pay | Admitting: Cardiovascular Disease

## 2024-05-02 ENCOUNTER — Ambulatory Visit: Payer: Self-pay | Admitting: *Deleted

## 2024-05-02 NOTE — Telephone Encounter (Signed)
 FYI Only or Action Required?: FYI only for provider.  Patient was last seen in primary care on 12/22/2023 by Kenneth Kistler Norleen BROCKS, MD.  Called Nurse Triage reporting Back Pain.  Symptoms began several weeks ago.  Interventions attempted: OTC medications: tylenol  mild relief.  Symptoms are: gradually worsening.  Triage Disposition: See PCP When Office is Open (Within 3 Days)  Patient/caregiver understands and will follow disposition?: Yes     Copied from CRM #[id number]. Topic: Clinical - Red Word Triage >> May 02, 2024  8:31 AM Kenneth Romero wrote: Red Word that prompted transfer to Nurse Triage: patient has been having serious back pain , making it difficult for him to move and walk >> May 02, 2024  9:00 AM Kenneth C wrote: Patient couldn't hold on any longer, would like a callback regarding this  Reason for Disposition  [1] MODERATE back pain (e.g., interferes with normal activities) AND [2] present > 3 days  Answer Assessment - Initial Assessment Questions Appt scheduled tomorrow     1. ONSET: When did the pain begin? (e.g., minutes, hours, days)     2 weeks ago  2. LOCATION: Where does it hurt? (upper, mid or lower back)     Low back  3. SEVERITY: How bad is the pain?  (e.g., Scale 1-10; mild, moderate, or severe)     At rest no pain and occasional 1-2/ 10 , with mobility 4-5 /10  4. PATTERN: Is the pain constant? (e.g., yes, no; constant, intermittent)      Intermittent with pain but continues for 2 weeks  5. RADIATION: Does the pain shoot into your legs or somewhere else?     No  6. CAUSE:  What do you think is causing the back pain?      Not sure but has had in the past but resolves in day or 2. 7. BACK OVERUSE:  Any recent lifting of heavy objects, strenuous work or exercise?     No  8. MEDICINES: What have you taken so far for the pain? (e.g., nothing, acetaminophen , NSAIDS)     Tylenol  with mild relief 9. NEUROLOGIC SYMPTOMS: Do you have any weakness,  numbness, or problems with bowel/bladder control?     No leg cramping at times.  10. OTHER SYMPTOMS: Do you have any other symptoms? (e.g., fever, abdomen pain, burning with urination, blood in urine)       Recent dx osteoporosis , low back pain .  11. PREGNANCY: Is there any chance you are pregnant? When was your last menstrual period?       na  Protocols used: Back Pain-A-AH

## 2024-05-03 ENCOUNTER — Encounter: Payer: Self-pay | Admitting: Family Medicine

## 2024-05-03 ENCOUNTER — Ambulatory Visit
Admission: RE | Admit: 2024-05-03 | Discharge: 2024-05-03 | Disposition: A | Source: Ambulatory Visit | Attending: Family Medicine | Admitting: Family Medicine

## 2024-05-03 ENCOUNTER — Ambulatory Visit: Admitting: Family Medicine

## 2024-05-03 VITALS — BP 128/68 | HR 71 | Ht 70.0 in | Wt 163.8 lb

## 2024-05-03 DIAGNOSIS — M816 Localized osteoporosis [Lequesne]: Secondary | ICD-10-CM

## 2024-05-03 DIAGNOSIS — S32010A Wedge compression fracture of first lumbar vertebra, initial encounter for closed fracture: Secondary | ICD-10-CM | POA: Diagnosis not present

## 2024-05-03 DIAGNOSIS — M545 Low back pain, unspecified: Secondary | ICD-10-CM

## 2024-05-03 NOTE — Progress Notes (Signed)
   Subjective:    Patient ID: QUANTE PETTRY, male    DOB: Feb 25, 1951, 73 y.o.   MRN: [id number]  Discussed the use of AI scribe software for clinical note transcription with the patient, who gave verbal consent to proceed.  History of Present Illness   KHAIR CHASTEEN is a 73 year old male with osteoporosis who presents with low back pain.  Two weeks ago, he experienced sudden onset stiffness and immobility in his lower back during his usual morning routine, which includes yoga and sit-ups. He noted difficulty performing back bending and twisting exercises, with stiffness affecting both sides but more pronounced on the right. The onset followed a weekend involving more lifting than usual, although he did not recall any specific injury at that time.  The back pain has persisted for two weeks without significant improvement. He occasionally takes Tylenol  for relief, which helps manage the pain. The pain does not radiate to his legs, and there is no weakness, numbness, or tingling. Prolonged standing and walking exacerbate the pain, limiting his ability to walk around the block comfortably. He describes the pain as more than normal.  He has a recent diagnosis of osteoporosis and is concerned about its potential connection to his back pain. At its worst, the pain affected his posture, making it difficult to stand up straight and causing him to feel hunched over. He has not experienced any trauma or falls that could have contributed to the pain.           Review of Systems     Objective:    Physical Exam Slight splinting was noted when he got out of the chair.  He does not have good lumbar lordosis.  Good motion of his back.  Hip motion is normal.  DTRs are 1+.  Straight leg raising was negative.      Assessment & Plan:     Low back pain in the setting of osteoporosis Acute low back pain for two weeks, likely musculoskeletal strain or osteoporosis-related changes. No neurological  deficits. Tylenol  effective. Discussed natural course of back pain, with most resolving within three months. - Order lumbar spine X-ray to assess for structural changes or fractures. - Advise regular use of extra strength Tylenol , two tablets three times daily. - Consider NSAIDs like Advil or Aleve if pain persists. - Discuss potential for physical therapy if X-ray shows no significant findings. - Reassess based on X-ray results.

## 2024-05-05 ENCOUNTER — Ambulatory Visit: Payer: Self-pay | Admitting: Family Medicine

## 2024-05-05 NOTE — Addendum Note (Signed)
 Addended by: JOYCE NORLEEN BROCKS on: 05/05/2024 10:06 AM   Modules accepted: Orders

## 2024-05-10 ENCOUNTER — Ambulatory Visit (INDEPENDENT_AMBULATORY_CARE_PROVIDER_SITE_OTHER): Admitting: Physician Assistant

## 2024-05-10 DIAGNOSIS — S32000A Wedge compression fracture of unspecified lumbar vertebra, initial encounter for closed fracture: Secondary | ICD-10-CM | POA: Insufficient documentation

## 2024-05-10 DIAGNOSIS — M816 Localized osteoporosis [Lequesne]: Secondary | ICD-10-CM

## 2024-05-10 NOTE — Progress Notes (Signed)
 Office Visit Note   Patient: Kenneth Romero           Date of Birth: 1950/11/14           MRN: [id number] Visit Date: 05/10/2024              Requested by: Joyce Norleen BROCKS, MD [address] [place],  [place] [address] PCP: Joyce Norleen BROCKS, MD   Assessment & Plan: Visit Diagnoses:  1. 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  He was referred for evaluation of an L1 compression fracture.  He is about 3 weeks since he started having the pain.  He admits it is feeling better no loss of bowel or bladder control.  We talked about the history of this he does have a recent bone density scan which places him in the osteoporosis category.  Earlier this year he was started on Boniva  by Dr. Joyce.  We talked about vitamin D  and calcium  we did draw vitamin D  on him today.  Has no other red flags of why he would have osteoporosis he is does not know his family history.  Will see him back in a couple weeks see how he is doing we also talked about when he is feeling better to go back to doing some resistance training.  Would like x-rays of his lumbar spine when he returns  Follow-Up Instructions: Return in about 2 weeks (around 05/24/2024).   Orders:  No orders of the defined types were placed in this encounter.  No orders of the defined types were placed in this encounter.     Procedures: No procedures performed   Clinical Data: No additional findings.   Subjective: Chief Complaint  Patient presents with   Lower Back - Pain    HPI Pleasant 73 year old gentleman comes in today for evaluation of compression fracture of L1.  No history of injury no history of radiation treatment.  Has been placed on Boniva  earlier this year after a low bone density scan Review of Systems  All other systems reviewed and are negative.    Objective: Vital  Signs: There were no vitals taken for this visit.  Physical Exam Constitutional:      Appearance: Normal appearance.  Pulmonary:     Effort: Pulmonary effort is normal.     Breath sounds: Normal breath sounds.  Skin:    General: Skin is warm and dry.  Neurological:     General: No focal deficit present.     Mental Status: He is alert and oriented to person, place, and time.  Psychiatric:        Mood and Affect: Mood normal.        Behavior: Behavior normal.     Ortho Exam Examination he has minimal tenderness no crepitus over the lower back.  He has good strength with resisted extension and flexion of his legs no pain with manipulation of his hip no sensation changes neurologically intact Specialty Comments:  No specialty comments available.  Imaging: No results found.   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  Past Surgical History:  Procedure Laterality Date   ANUS SURGERY     COLONOSCOPY  07/07/2004   DR.HUNG   SIGMOIDOSCOPY  07/07/2008   DR.HUNG   Social History   Occupational History   Not on file  Tobacco Use   Smoking status: Former    Current packs/day: 0.00    Types: Cigarettes    Quit date: 07/07/1972    Years since quitting: 51.8   Smokeless tobacco: Never  Vaping Use   Vaping status: Never Used  Substance and Sexual Activity   Alcohol use: Yes    Alcohol/week: 3.0 standard drinks of alcohol    Types: 3 Glasses of wine per week    Comment: or less   Drug  use: No   Sexual activity: Not Currently

## 2024-05-10 NOTE — Addendum Note (Signed)
 Addended by: EILLEEN ROSINA HERO on: 05/10/2024 01:07 PM   Modules accepted: Orders

## 2024-05-11 LAB — VITAMIN D 25 HYDROXY (VIT D DEFICIENCY, FRACTURES): Vit D, 25-Hydroxy: 33 ng/mL (ref 30–100)

## 2024-05-26 DIAGNOSIS — H47233 Glaucomatous optic atrophy, bilateral: Secondary | ICD-10-CM | POA: Diagnosis not present

## 2024-05-26 DIAGNOSIS — H2513 Age-related nuclear cataract, bilateral: Secondary | ICD-10-CM | POA: Diagnosis not present

## 2024-05-26 DIAGNOSIS — H401231 Low-tension glaucoma, bilateral, mild stage: Secondary | ICD-10-CM | POA: Diagnosis not present

## 2024-05-31 ENCOUNTER — Other Ambulatory Visit: Payer: Self-pay

## 2024-05-31 ENCOUNTER — Encounter: Payer: Self-pay | Admitting: Physician Assistant

## 2024-05-31 ENCOUNTER — Ambulatory Visit: Admitting: Physician Assistant

## 2024-05-31 DIAGNOSIS — S32000A Wedge compression fracture of unspecified lumbar vertebra, initial encounter for closed fracture: Secondary | ICD-10-CM

## 2024-05-31 NOTE — Progress Notes (Signed)
 Office Visit Note   Patient: Kenneth Romero           Date of Birth: 08/13/1950           MRN: [id number] Visit Date: 05/31/2024              Requested by: Joyce Norleen BROCKS, MD [address] [place],  [place] [address] PCP: Joyce Norleen BROCKS, MD  Chief Complaint  Patient presents with   Lower Back - Follow-up      HPI: Patient is now 6 weeks status post compression fracture L1 he is doing much better.  He is got back to the gym and really has almost no pain.  Assessment & Plan: Visit Diagnoses:  1. Compression fracture of lumbar vertebra, unspecified lumbar vertebral level, initial encounter [facility])     Plan: Patient is being treated by his primary care with Boniva .  We talked about slowly adding some resistance training but not to add it too quickly.  Also he is going to be sure he is taking adequate vitamin D  as though he is in the normal range it is on the low end of normal May follow-up with me as needed  Follow-Up Instructions: No follow-ups on file.   Ortho Exam  Patient is alert, oriented, no adenopathy, well-dressed, normal affect, normal respiratory effort.  Examination of his low back he has no tenderness to palpation he is lower extremities are sensory and motor intact with good strength.  No crepitus    Imaging: No results found. No images are attached to the encounter.  Labs: No results found for: HGBA1C, ESRSEDRATE, CRP, LABURIC, REPTSTATUS, GRAMSTAIN, CULT, LABORGA   Lab Results  Component Value Date   ALBUMIN 4.3 12/22/2023   ALBUMIN 4.4 12/16/2022   ALBUMIN 4.5 11/06/2021    Lab Results  Component Value Date   MG 1.7 03/30/2021   Lab Results  Component Value Date   VD25OH 33 05/10/2024   VD25OH 25.5 (L) 12/22/2023    No results found for: PREALBUMIN    Latest Ref Rng & Units 12/22/2023    9:32 AM 12/16/2022    2:30 PM 11/06/2021    3:37 PM  CBC EXTENDED  WBC 3.4 - 10.8 x10E3/uL 5.0  5.3  6.3   RBC 4.14 - 5.80  x10E6/uL 4.72  4.70  4.72   Hemoglobin 13.0 - 17.7 g/dL 84.5  85.0  84.8   HCT 37.5 - 51.0 % 45.8  44.2  44.0   Platelets 150 - 450 x10E3/uL 222  229  245   NEUT# 1.4 - 7.0 x10E3/uL 2.9  3.0  3.8   Lymph# 0.7 - 3.1 x10E3/uL 1.4  1.6  1.8      There is no height or weight on file to calculate BMI.  Orders:  Orders Placed This Encounter  Procedures   XR Lumbar Spine 2-3 Views   No orders of the defined types were placed in this encounter.    Procedures: No procedures performed  Clinical Data: No additional findings.  ROS:  All other systems negative, except as noted in the HPI. Review of Systems  Objective: Vital Signs: There were no vitals taken for this visit.  Specialty Comments:  No specialty comments available.  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  Past Surgical History:  Procedure Laterality Date   ANUS SURGERY     COLONOSCOPY  07/07/2004   DR.HUNG   SIGMOIDOSCOPY  07/07/2008   DR.HUNG   Social History   Occupational History   Not on file  Tobacco Use   Smoking status: Former    Current packs/day: 0.00    Types: Cigarettes    Quit date: 07/07/1972    Years since quitting: 51.9   Smokeless tobacco: Never  Vaping Use   Vaping status: Never Used  Substance and Sexual Activity   Alcohol use: Yes    Alcohol/week: 3.0 standard drinks of alcohol    Types: 3 Glasses of wine per week    Comment: or less   Drug use: No   Sexual activity: Not Currently

## 2024-07-25 ENCOUNTER — Other Ambulatory Visit: Payer: Self-pay

## 2024-07-25 ENCOUNTER — Emergency Department (HOSPITAL_COMMUNITY)

## 2024-07-25 ENCOUNTER — Emergency Department (HOSPITAL_COMMUNITY)
Admission: EM | Admit: 2024-07-25 | Discharge: 2024-07-25 | Disposition: A | Discharge status: Home / Self Care | Attending: Emergency Medicine | Admitting: Emergency Medicine

## 2024-07-25 ENCOUNTER — Encounter (HOSPITAL_COMMUNITY): Payer: Self-pay | Admitting: Pharmacy Technician

## 2024-07-25 DIAGNOSIS — S0083XA Contusion of other part of head, initial encounter: Secondary | ICD-10-CM | POA: Diagnosis not present

## 2024-07-25 DIAGNOSIS — W010XXA Fall on same level from slipping, tripping and stumbling without subsequent striking against object, initial encounter: Secondary | ICD-10-CM | POA: Insufficient documentation

## 2024-07-25 DIAGNOSIS — W19XXXA Unspecified fall, initial encounter: Secondary | ICD-10-CM

## 2024-07-25 DIAGNOSIS — S42492A Other displaced fracture of lower end of left humerus, initial encounter for closed fracture: Secondary | ICD-10-CM | POA: Diagnosis not present

## 2024-07-25 DIAGNOSIS — S42402A Unspecified fracture of lower end of left humerus, initial encounter for closed fracture: Secondary | ICD-10-CM

## 2024-07-25 DIAGNOSIS — Z79899 Other long term (current) drug therapy: Secondary | ICD-10-CM | POA: Insufficient documentation

## 2024-07-25 DIAGNOSIS — S59902A Unspecified injury of left elbow, initial encounter: Secondary | ICD-10-CM | POA: Diagnosis present

## 2024-07-25 MED ORDER — OXYCODONE HCL 5 MG PO TABS: 5.0000 mg | ORAL_TABLET | Freq: Four times a day (QID) | ORAL | 0 refills | Status: DC | PRN

## 2024-07-25 MED ORDER — OXYCODONE HCL 5 MG PO TABS
5.0000 mg | ORAL_TABLET | Freq: Once | ORAL | Status: AC
  Administered 2024-07-25: 5 mg via ORAL
  Filled 2024-07-25: qty 1

## 2024-07-25 NOTE — Discharge Instructions (Signed)
 He was seen in the emergency department for evaluation of injuries from a fall.  You had a complex fracture of your left elbow.  You were placed in a splint and sling.  Please try to keep it elevated.  Ice 20 minutes on 20 minutes off while awake.  We are prescribing you a short course of narcotic pain medication.  Please use caution as may make dizzy nauseous and lead to constipation.  Call orthopedics Dr. Elsa tomorrow for further instructions on follow-up.  Return if any worsening or concerning symptoms

## 2024-07-25 NOTE — ED Provider Notes (Signed)
 " [place] EMERGENCY DEPARTMENT AT [facility] Provider Note   CSN: [id number] Arrival date & time: 07/25/24  1408     Patient presents with: Kenneth Romero is a 74 y.o. male.   This patient is a very pleasant 74 year old male here today after he was walking, looking up tripped over a curb and landed onto his left elbow.  He is right-hand dominant.  He is a psychologist, clinical.  Not on blood thinners, denies loss of consciousness.  No pain in his neck.   Fall       Prior to Admission medications  Medication Sig Start Date End Date Taking? Authorizing Provider  acetaminophen  (TYLENOL ) 325 MG tablet Take 2 tablets (650 mg total) by mouth every 6 (six) hours as needed for headache. 03/31/21   Lue Elsie BROCKS, MD  Ascorbic Acid (VITAMIN C) 1000 MG tablet Take 1,000 mg by mouth 3 (three) times daily.    [provider]  atorvastatin  (LIPITOR) 20 MG tablet Take 1 tablet (20 mg total) by mouth daily. 12/23/23 12/22/24  Joyce Norleen BROCKS, MD  fish oil-omega-3 fatty acids  1000 MG capsule Take 1 g by mouth daily.    [provider]  Garlic 100 MG TABS Take 1 tablet by mouth 1 day or 1 dose.    [provider]  ibandronate  (BONIVA ) 150 MG tablet Take 1 tablet (150 mg total) by mouth every 30 (thirty) days. Take in the morning with a full glass of water, on an empty stomach, and do not take anything else by mouth or lie down for the next 30 min. 12/22/23   Lalonde, John C, MD  metoprolol  succinate (TOPROL -XL) 25 MG 24 hr tablet Take 1 tablet (25 mg total) by mouth daily. 12/22/23   Lalonde, John C, MD  milk thistle 175 MG tablet Take 175 mg by mouth daily.    [provider]  Multiple Vitamins-Minerals (MULTIVITAMIN WITH MINERALS) tablet Take 1 tablet by mouth daily.    [provider]  VYZULTA  0.024 % SOLN Place 1 drop into both eyes at bedtime. 08/22/19   [provider]    Allergies: Sulfa antibiotics    Review of  Systems  Updated Vital Signs BP 127/75   Pulse (!) 57   Temp 97.6 F (36.4 C)   Resp 16   SpO2 98%   Physical Exam Vitals and nursing note reviewed.  Constitutional:      Appearance: He is not toxic-appearing.  HENT:     Head: Normocephalic.     Comments: Small contusion to the chin without laceration Eyes:     Extraocular Movements: Extraocular movements intact.  Cardiovascular:     Rate and Rhythm: Normal rate.  Pulmonary:     Effort: Pulmonary effort is normal.  Abdominal:     General: Abdomen is flat.     Palpations: Abdomen is soft.  Musculoskeletal:     Comments: Sling in place, no laceration over left elbow.  Tender to palpation over the fingers and olecranon.  Neurological:     General: No focal deficit present.     Mental Status: He is alert.     Comments: Normal median, radial, ulnar nerve sensation and function     (all labs ordered are listed, but only abnormal results are displayed) Labs Reviewed - No data to display  EKG: None  Radiology: DG Elbow Complete Left Result Date: 07/25/2024 CLINICAL DATA:  Swelling after a fall, pain. EXAM: LEFT ELBOW -  COMPLETE 3+ VIEW COMPARISON:  None Available. FINDINGS: Suboptimal views due to patient condition. There is disc comminuted fracture of the distal humerus with volar displacement of the distal fracture fragment. Fracture likely extends to the elbow joint. Surrounding soft tissue swelling. IMPRESSION: Suboptimal evaluation of a distal humerus fracture that likely extends to the elbow joint. Electronically Signed   By: Newell Eke M.D.   On: 07/25/2024 15:31   CT Head Wo Contrast Result Date: 07/25/2024 EXAM: CT HEAD WITHOUT CONTRAST 07/25/2024 03:02:24 PM TECHNIQUE: CT of the head was performed without the administration of intravenous contrast. Automated exposure control, iterative reconstruction, and/or weight based adjustment of the mA/kV was utilized to reduce the radiation dose to as low as reasonably  achievable. COMPARISON: None available. CLINICAL HISTORY: Ataxia, head trauma FINDINGS: BRAIN AND VENTRICLES: No acute hemorrhage. No evidence of acute infarct. No hydrocephalus. No extra-axial collection. No mass effect or midline shift. ORBITS: No acute abnormality. SINUSES: No acute abnormality. SOFT TISSUES AND SKULL: No acute soft tissue abnormality. No skull fracture. IMPRESSION: 1. No acute intracranial abnormality. Electronically signed by: Glendia Molt MD 07/25/2024 03:06 PM EST RP Workstation: [id number]     Procedures   Medications Ordered in the ED  oxyCODONE  (Oxy IR/ROXICODONE ) immediate release tablet 5 mg (5 mg Oral Given 07/25/24 1442)                                    Medical Decision Making 74 year old male here today after he fell into his left elbow and chin.  Plan-patient has no significant tenderness in his chin jaw or teeth.  Obtain CT imaging of the patient's head which was negative for acute intracranial process per my independent review, no intracranial hematoma.  Patient with a distal humerus fracture extending into the elbow joint.  Have placed orthopedic consult.  Reassessment 5 PM-pending orthopedic callback.  Signed out to Dr. Towana pending orthopedic recommendations.  Amount and/or Complexity of Data Reviewed Radiology: ordered.  Risk Prescription drug management.        Final diagnoses:  None    ED Discharge Orders     None          Mannie Fairy DASEN, DO 07/25/24 1704  "

## 2024-07-25 NOTE — Progress Notes (Signed)
 Orthopedic Tech Progress Note Patient Details:  Kenneth Romero 09-30-1950 [id number] Applied posterior long arm splint and sling immobilizer per order.  Ortho Devices Type of Ortho Device: Ace wrap, Cotton web roll, Long arm splint, Sling immobilizer Ortho Device/Splint Location: LUE Ortho Device/Splint Interventions: Ordered, Application, Adjustment   Post Interventions Patient Tolerated: Well Instructions Provided: Adjustment of device, Care of device, Poper ambulation with device  [name] 07/25/2024, 6:42 PM

## 2024-07-25 NOTE — ED Triage Notes (Signed)
 Pt bib ems after pt tripped over a curb and fell forward hitting his chin and his L elbow. Swelling noted to elbow. Splinted in field. VSS. CNS intact.

## 2024-07-25 NOTE — ED Provider Notes (Signed)
 Signout from Dr. Mannie.  74 year old male right-hand-dominant injury to left elbow after a slip and fall.  He has a closed left elbow fracture.  Awaiting callback from orthopedics for plan. Physical Exam  BP 127/75   Pulse (!) 57   Temp 97.6 F (36.4 C)   Resp 16   SpO2 98%   Physical Exam  Procedures  Procedures  ED Course / MDM    Medical Decision Making Amount and/or Complexity of Data Reviewed Radiology: ordered.  Risk Prescription drug management.   5:14 PM.  Discussed with Dr. Elsa orthopedics.  He would like the patient to get a CT.  Needs splint and outpatient follow-up.  CT showing complex distal humeral fracture with extension into the joint.  Patient was placed in a long-arm splint and sling.  Given contact information for outpatient orthopedic follow-up.  Prescription for narcotic pain medication.  Return instructions discussed     Kenneth Ozell BROCKS, MD 07/26/24 [phone]

## 2024-07-25 NOTE — ED Notes (Signed)
 Patient Alert and oriented to baseline. Stable and ambulatory to baseline. Patient verbalized understanding of the discharge instructions.  Patient belongings were taken by the patient.

## 2024-07-26 ENCOUNTER — Ambulatory Visit: Payer: Self-pay | Admitting: Student

## 2024-07-27 ENCOUNTER — Encounter (HOSPITAL_COMMUNITY): Payer: Self-pay | Admitting: Student

## 2024-07-27 ENCOUNTER — Other Ambulatory Visit: Payer: Self-pay

## 2024-07-27 NOTE — H&P (Signed)
 Orthopaedic Trauma Service (OTS) H&P  Patient ID: Kenneth Romero MRN: [id number] DOB/AGE: May 22, 1951 74 y.o.  Reason for surgery: Left distal humerus fracture HPI: Kenneth Romero is a 74 y.o. RHD male presenting for surgery on left upper extremity.  Patient was outside walking on 07/25/2024 when he tripped over a curb, landing on the left elbow.  Had immediate pain in the left elbow and difficulty with range of motion.  He was seen in the [facility] emergency department and found to have a left distal humerus fracture.  Patient was placed in a long-arm splint and instructed to follow-up with orthopedics on outpatient basis.  Was seen in OTS clinic on 07/26/2024 for further evaluation.  He presents now for surgical fixation of his fracture. Patient is a psychologist, clinical.  Denies any other injuries from his fall.  No previous injury or surgery noted to the left upper extremity.  Patient is not currently on any anticoagulation.  Past Medical History:  Diagnosis Date   Anal intraepithelial neoplasia I (AIN I)    Vitiligo    Past Surgical History:  Procedure Laterality Date   ANUS SURGERY     COLONOSCOPY  07/07/2004   DR.HUNG   SIGMOIDOSCOPY  07/07/2008   DR.HUNG   No family history on file.  Social History:  reports that he quit smoking about 52 years ago. His smoking use included cigarettes. He has never used smokeless tobacco. He reports current alcohol use of about 3.0 standard drinks of alcohol per week. He reports that he does not use drugs.  Allergies: Allergies[1]  Medications: Prior to Admission medications  Medication Sig Start Date End Date Taking? Authorizing Provider  acetaminophen  (TYLENOL ) 325 MG tablet Take 2 tablets (650 mg total) by mouth every 6 (six) hours as needed for headache. 03/31/21  Yes Lue Elsie BROCKS, MD  Ascorbic Acid  (VITAMIN C ) 1000 MG tablet Take 1,000 mg by mouth 3 (three) times daily.   Yes [provider]  atorvastatin  (LIPITOR) 20 MG tablet  Take 1 tablet (20 mg total) by mouth daily. 12/23/23 12/22/24 Yes Joyce Norleen BROCKS, MD  fish oil-omega-3 fatty acids  1000 MG capsule Take 1 g by mouth daily.   Yes [provider]  Garlic 100 MG TABS Take 1 tablet by mouth 1 day or 1 dose.   Yes [provider]  ibandronate  (BONIVA ) 150 MG tablet Take 1 tablet (150 mg total) by mouth every 30 (thirty) days. Take in the morning with a full glass of water, on an empty stomach, and do not take anything else by mouth or lie down for the next 30 min. 12/22/23  Yes Joyce Norleen BROCKS, MD  metoprolol  succinate (TOPROL -XL) 25 MG 24 hr tablet Take 1 tablet (25 mg total) by mouth daily. 12/22/23  Yes Joyce Norleen BROCKS, MD  milk thistle 175 MG tablet Take 175 mg by mouth daily.   Yes [provider]  Multiple Vitamins-Minerals (MULTIVITAMIN WITH MINERALS) tablet Take 1 tablet by mouth daily.   Yes [provider]  oxyCODONE  (ROXICODONE ) 5 MG immediate release tablet Take 1 tablet (5 mg total) by mouth every 6 (six) hours as needed for severe pain (pain score 7-10). 07/25/24  Yes Towana Ozell BROCKS, MD  VYZULTA  0.024 % SOLN Place 1 drop into both eyes at bedtime. 08/22/19  Yes [provider]   I have reviewed the patient's current medications. Prior to Admission:  No medications prior to admission.    Positive ROS: All other systems  have been reviewed and were otherwise negative with the exception of those mentioned in the HPI and as above.  Exam: There were no vitals taken for this visit. General: Alert and oriented, no acute distress Cardiovascular: No pedal edema Respiratory: No cyanosis, no use of accessory musculature GI: No organomegaly, abdomen is soft and non-tender Skin: No lesions in the area of chief complaint Neurologic: Sensation intact distally Psychiatric: Patient is competent for consent with normal mood and affect  Musculoskeletal: Left upper extremity: Long-arm splint in place.  Nontender above  splint.  Fingers warm and well-perfused.  Endorses sensation light touch over all aspects of the hand.    Right upper extremity: Skin without lesions. No tenderness to palpation. Full painless ROM, full strength in each muscle group without evidence of instability. Motor/sensory function at baseline. Neurovascularly intact.   Medical Decision Making: Data: Imaging: CT scan left elbow shows impacted comminuted, displaced distal humerus fracture with intra-articular extension into the elbow joint.  Labs: No results found for this or any previous visit (from the past week).   Medical history and chart was reviewed and case discussed with attending provider.  Assessment/Plan: 74 year old male s/p fall on 07/25/24 resulting in left distal humerus fracture  Significant injury to left upper extremity which require surgical intervention.  Recommend proceeding with open duction internal fixation of the left distal humerus fracture.  Risks and benefits of procedure have been discussed with the patient. Risks discussed included bleeding, infection, malunion, nonunion, damage to surrounding nerves and blood vessels, pain, hardware prominence or irritation, hardware failure, stiffness, post-traumatic arthritis, DVT/PE, compartment syndrome, and even anesthesia complications.  Patient states understanding these risks and agrees to proceed with surgery.  Consent obtained.  Will plan to admit the patient overnight for observation and pain control and likely discharge home on postoperative day #1.    Lauraine PATRIC Moores PA-C [facility] [phone] (office) [email or web address]     [1]  Allergies Allergen Reactions   Sulfa Antibiotics Other (See Comments)    Unknown (childhood)

## 2024-07-27 NOTE — Progress Notes (Signed)
 PCP - Joyce Norleen BROCKS, MD  Cardiologist - Court Dorn PARAS, MD    PPM/ICD - denies Device Orders - n/a Rep Notified - n/a  Chest x-ray - denies EKG - 01-11-24 Stress Test - denies ECHO - 05-10-21 Cardiac Cath - denies  CPAP - denies  Dm -denies  Blood Thinner Instructions: denies Aspirin  Instructions: denies  ERAS Protcol - NPO  COVID TEST- n/a  Anesthesia review: yes  Patient verbally denies any shortness of breath, fever, cough and chest pain during phone call   -------------  SDW INSTRUCTIONS given:  Your procedure is scheduled on July 29, 2024.  Report to [facility] Main Entrance A at 5:30 A.M., and check in at the Admitting office.  Call this number if you have problems the morning of surgery:  [phone]   Remember:  Do not eat or drink after midnight the night before your surgery      Take these medicines the morning of surgery with A SIP OF WATER  acetaminophen  (TYLENOL )  atorvastatin  (LIPITOR)  metoprolol  succinate (TOPROL -XL)  oxyCODONE  (ROXICODONE )   As of today, STOP taking any Aspirin  (unless otherwise instructed by your surgeon) Aleve, Naproxen, Ibuprofen, Motrin, Advil, Goody's, BC's, all herbal medications, fish oil, and all vitamins.                      Do not wear jewelry, make up, or nail polish            Do not wear lotions, powders, perfumes/colognes, or deodorant.            Do not shave 48 hours prior to surgery.  Men may shave face and neck.            Do not bring valuables to the hospital.            [facility] is not responsible for any belongings or valuables.  Do NOT Smoke (Tobacco/Vaping) 24 hours prior to your procedure If you use a CPAP at night, you may bring all equipment for your overnight stay.   Contacts, glasses, dentures or bridgework may not be worn into surgery.      For patients admitted to the hospital, discharge time will be determined by your treatment team.   Patients discharged the day of surgery  will not be allowed to drive home, and someone needs to stay with them for 24 hours.    Special instructions:   [place]- Preparing For Surgery  Before surgery, you can play an important role. Because skin is not sterile, your skin needs to be as free of germs as possible. You can reduce the number of germs on your skin by washing with CHG (chlorahexidine gluconate) Soap before surgery.  CHG is an antiseptic cleaner which kills germs and bonds with the skin to continue killing germs even after washing.    Oral Hygiene is also important to reduce your risk of infection.  Remember - BRUSH YOUR TEETH THE MORNING OF SURGERY WITH YOUR REGULAR TOOTHPASTE  Please do not use if you have an allergy to CHG or antibacterial soaps. If your skin becomes reddened/irritated stop using the CHG.  Do not shave (including legs and underarms) for at least 48 hours prior to first CHG shower. It is OK to shave your face.  Please follow these instructions carefully.   Shower the NIGHT BEFORE SURGERY and the MORNING OF SURGERY with DIAL Soap.   Pat yourself dry with a CLEAN TOWEL.  Wear  CLEAN PAJAMAS to bed the night before surgery  Place CLEAN SHEETS on your bed the night of your first shower and DO NOT SLEEP WITH PETS.   Day of Surgery: Please shower morning of surgery  Wear Clean/Comfortable clothing the morning of surgery Do not apply any deodorants/lotions.   Remember to brush your teeth WITH YOUR REGULAR TOOTHPASTE.   Questions were answered. Patient verbalized understanding of instructions.

## 2024-07-28 ENCOUNTER — Encounter (HOSPITAL_COMMUNITY): Payer: Self-pay | Admitting: Student

## 2024-07-28 NOTE — Progress Notes (Signed)
 Anesthesia Chart Review: SAME DAY WORK-UP  Case: [id number] Date/Time: 07/29/24 0715   Procedure: OPEN REDUCTION INTERNAL FIXATION (ORIF) DISTAL HUMERUS FRACTURE (Left)   Anesthesia type: General   Pre-op diagnosis: Left distal humerus fracture   Location: [facility] OR ROOM 03 / [facility] OR   Surgeons: Kendal Franky SQUIBB, MD       DISCUSSION: Patient is a 74 year old male scheduled for the above procedure. He had a mechanical fall on 07/25/2024 and sustained a left distal humerus fracture.  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  Last cardiology visit with Dr. Court was on 01/11/2024. History of PAF which started after COVID in 2022. He did wear a monitor for 2 weeks that showed runs of A-fib with a percent burden. He has CT score was 1 and therefore he has not been on Eliquis  or anticoagulation. He has as needed Cardizem  which she is taken half a dozen times in the last 3 years. He does know when he is in A-fib. He is in sinus rhythm today.  12 month follow-up planned.    He is a same-day workup.  Updated labs on arrival as indicated.  Anesthesia team to evaluate on the day of surgery.  VS: Ht 5' 10 (1.778 m)   Wt 73.4 kg   BMI 23.22 kg/m  BP Readings from Last 3 Encounters:  07/25/24 132/66  05/03/24 128/68  01/11/24 121/77   Pulse Readings from Last 3 Encounters:  07/25/24 61  05/03/24 71  01/11/24 61     PROVIDERS: Joyce Norleen BROCKS, MD is PCP  Court Carrier, MD is cardiologist Rollin Dover, MD is GI   LABS: For day of surgery as indicated. Most recent results in [facility] include: Lab Results  Component Value Date   WBC 5.0 12/22/2023   HGB 15.4 12/22/2023   HCT 45.8 12/22/2023   PLT 222 12/22/2023   GLUCOSE 94 12/22/2023   CHOL 186 12/22/2023   TRIG 64 12/22/2023   HDL 70 12/22/2023   LDLCALC 104 (H) 12/22/2023   ALT 20 12/22/2023   AST 26 12/22/2023   NA 133 (L) 12/22/2023   K 4.7 12/22/2023    CL 97 12/22/2023   CREATININE 0.59 (L) 12/22/2023   BUN 13 12/22/2023   CO2 21 12/22/2023   TSH 4.321 03/30/2021   PSA 0.87 12/26/2014     IMAGES: CT left elbow 07/25/2024: IMPRESSION: 1. Impacted, comminuted, displaced, and angulated distal left humeral fracture with intraarticular extension into the elbow joint. 2. No dislocation.  CT Head 08/04/2024: IMPRESSION: 1. No acute intracranial abnormality.   EKG: 01/11/2024 [facility]): Sinus rhythm with Premature atrial complexes Rightward axis When compared with ECG of 13-Jul-2023 13:25, Premature atrial complexes are now Present Questionable change in QRS axis T wave inversion now evident in Inferior leads Confirmed by Court Carrier [phone]) on 01/11/2024 9:56:50 AM   CV: Long term monitor 04/26/2021 - 05/09/2021: 1. NSR with sinus brady and sinus tachycardia 2. Frequent PAC's and occaisional PVC's 3. Brief NS Atrial tachycardia 4. Runs of atrial fib with a RVR and a controlled VR (8% burden) 5. No prolonged pauses or significant bradycardia  Echo 05/10/2021: IMPRESSIONS   1. Left ventricular ejection fraction, by estimation, is 60 to 65%. The  left ventricle has normal function. The left ventricle has no regional  wall motion abnormalities. There is mild left ventricular hypertrophy.  Left ventricular diastolic parameters  were normal.  2. Right ventricular systolic function is normal. The right ventricular  size is normal.   3. The mitral valve is normal in structure. Mild mitral valve  regurgitation. No evidence of mitral stenosis.   4. The aortic valve is normal in structure. Aortic valve regurgitation is  mild.   5. The inferior vena cava is normal in size with greater than 50%  respiratory variability, suggesting right atrial pressure of 3 mmHg.  - Comparison(s): No prior Echocardiogram.    Past Medical History:  Diagnosis Date   Anal intraepithelial neoplasia I (AIN I)    Arthritis    Atrial fib/flutter,  transient (HCC)    Dysrhythmia    Vitiligo     Past Surgical History:  Procedure Laterality Date   ANUS SURGERY     COLONOSCOPY  07/07/2004   DR.HUNG   SIGMOIDOSCOPY  07/07/2008   DR.HUNG    MEDICATIONS:  acetaminophen  (TYLENOL ) 325 MG tablet   Ascorbic Acid (VITAMIN C) 1000 MG tablet   atorvastatin  (LIPITOR) 20 MG tablet   fish oil-omega-3 fatty acids  1000 MG capsule   Garlic 100 MG TABS   ibandronate  (BONIVA ) 150 MG tablet   metoprolol  succinate (TOPROL -XL) 25 MG 24 hr tablet   milk thistle 175 MG tablet   Multiple Vitamins-Minerals (MULTIVITAMIN WITH MINERALS) tablet   oxyCODONE  (ROXICODONE ) 5 MG immediate release tablet   VYZULTA  0.024 % SOLN   Isaiah Ruder, PA-C Surgical Short Stay/Anesthesiology [facility] Phone [phone] [facility] Phone [phone] 07/28/2024 12:28 PM

## 2024-07-28 NOTE — Anesthesia Preprocedure Evaluation (Addendum)
 "                                  Anesthesia Evaluation  Patient identified by MRN, date of birth, ID band Patient awake    Reviewed: Allergy & Precautions, NPO status , Patient's Chart, lab work & pertinent test results  Airway Mallampati: II  TM Distance: >3 FB     Dental  (+) Teeth Intact, Dental Advisory Given, Caps   Pulmonary former smoker   Pulmonary exam normal breath sounds clear to auscultation       Cardiovascular Pt. on home beta blockers Normal cardiovascular exam+ dysrhythmias Atrial Fibrillation  Rhythm:Regular Rate:Normal  Echo 05/10/21  1. Left ventricular ejection fraction, by estimation, is 60 to 65%. The  left ventricle has normal function. The left ventricle has no regional  wall motion abnormalities. There is mild left ventricular hypertrophy.  Left ventricular diastolic parameters  were normal.   2. Right ventricular systolic function is normal. The right ventricular  size is normal.   3. The mitral valve is normal in structure. Mild mitral valve  regurgitation. No evidence of mitral stenosis.   4. The aortic valve is normal in structure. Aortic valve regurgitation is  mild.   5. The inferior vena cava is normal in size with greater than 50%  respiratory variability, suggesting right atrial pressure of 3 mmHg.    EKG 01/11/24 NSR with PAC's, RAD   Neuro/Psych negative neurological ROS  negative psych ROS   GI/Hepatic negative GI ROS, Neg liver ROS,,,  Endo/Other  HLD  Renal/GU negative Renal ROSLab Results      Component                Value               Date                      NA                       133 (L)             12/22/2023                CL                       97                  12/22/2023                K                        4.7                 12/22/2023                CO2                      21                  12/22/2023                BUN                      13  12/22/2023                 CREATININE               0.59 (L)            12/22/2023                EGFR                     102                 12/22/2023                CALCIUM                   9.6                 12/22/2023                ALBUMIN                   4.3                 12/22/2023                GLUCOSE                  94                  12/22/2023             negative genitourinary   Musculoskeletal  (+) Arthritis , Osteoarthritis,  Left distal humerus Fx Osteoporosis    Abdominal   Peds  Hematology negative hematology ROS (+) Lab Results      Component                Value               Date                      WBC                      5.0                 12/22/2023                HGB                      15.4                12/22/2023                HCT                      45.8                12/22/2023                MCV                      97                  12/22/2023                PLT                      222  12/22/2023              Anesthesia Other Findings   Reproductive/Obstetrics                              Anesthesia Physical Anesthesia Plan  ASA: 3  Anesthesia Plan: Regional and MAC   Post-op Pain Management: Regional block* and Minimal or no pain anticipated   Induction: Intravenous  PONV Risk Score and Plan: 2 and Treatment may vary due to age or medical condition and Propofol  infusion  Airway Management Planned: Natural Airway and Simple Face Mask  Additional Equipment: None  Intra-op Plan:   Post-operative Plan:   Informed Consent: I have reviewed the patients History and Physical, chart, labs and discussed the procedure including the risks, benefits and alternatives for the proposed anesthesia with the patient or authorized representative who has indicated his/her understanding and acceptance.     Dental advisory given  Plan Discussed with: CRNA and Anesthesiologist  Anesthesia Plan Comments: (PAT note  written 07/28/2024 by Allison Zelenak, PA-C.  )         Anesthesia Quick Evaluation  "

## 2024-07-29 ENCOUNTER — Encounter (HOSPITAL_COMMUNITY): Payer: Self-pay | Admitting: Student

## 2024-07-29 ENCOUNTER — Other Ambulatory Visit (HOSPITAL_COMMUNITY): Payer: Self-pay

## 2024-07-29 ENCOUNTER — Ambulatory Visit (HOSPITAL_COMMUNITY)

## 2024-07-29 ENCOUNTER — Observation Stay (HOSPITAL_COMMUNITY): Admission: RE | Admit: 2024-07-29 | Discharge: 2024-07-30 | Disposition: A | Attending: Student | Admitting: Student

## 2024-07-29 ENCOUNTER — Other Ambulatory Visit: Payer: Self-pay

## 2024-07-29 ENCOUNTER — Ambulatory Visit (HOSPITAL_COMMUNITY): Payer: Self-pay | Admitting: Vascular Surgery

## 2024-07-29 ENCOUNTER — Observation Stay (HOSPITAL_COMMUNITY)

## 2024-07-29 ENCOUNTER — Encounter (HOSPITAL_COMMUNITY): Admission: RE | Payer: Self-pay | Source: Home / Self Care

## 2024-07-29 DIAGNOSIS — E785 Hyperlipidemia, unspecified: Secondary | ICD-10-CM

## 2024-07-29 DIAGNOSIS — E559 Vitamin D deficiency, unspecified: Secondary | ICD-10-CM | POA: Diagnosis not present

## 2024-07-29 DIAGNOSIS — R2681 Unsteadiness on feet: Secondary | ICD-10-CM | POA: Diagnosis not present

## 2024-07-29 DIAGNOSIS — S42402A Unspecified fracture of lower end of left humerus, initial encounter for closed fracture: Secondary | ICD-10-CM | POA: Diagnosis not present

## 2024-07-29 DIAGNOSIS — I4891 Unspecified atrial fibrillation: Secondary | ICD-10-CM | POA: Diagnosis not present

## 2024-07-29 DIAGNOSIS — Z87891 Personal history of nicotine dependence: Secondary | ICD-10-CM | POA: Diagnosis not present

## 2024-07-29 DIAGNOSIS — X58XXXA Exposure to other specified factors, initial encounter: Secondary | ICD-10-CM | POA: Diagnosis not present

## 2024-07-29 DIAGNOSIS — S42492A Other displaced fracture of lower end of left humerus, initial encounter for closed fracture: Principal | ICD-10-CM | POA: Diagnosis present

## 2024-07-29 HISTORY — DX: Unspecified atrial fibrillation: I48.91

## 2024-07-29 HISTORY — DX: Cardiac arrhythmia, unspecified: I49.9

## 2024-07-29 HISTORY — DX: Unspecified osteoarthritis, unspecified site: M19.90

## 2024-07-29 HISTORY — DX: Unspecified atrial flutter: I48.92

## 2024-07-29 LAB — BASIC METABOLIC PANEL WITH GFR
Anion gap: 10 (ref 5–15)
BUN: 12 mg/dL (ref 8–23)
CO2: 22 mmol/L (ref 22–32)
Calcium: 8.7 mg/dL — ABNORMAL LOW (ref 8.9–10.3)
Chloride: 97 mmol/L — ABNORMAL LOW (ref 98–111)
Creatinine, Ser: 0.49 mg/dL — ABNORMAL LOW (ref 0.61–1.24)
GFR, Estimated: >60 mL/min
Glucose, Bld: 118 mg/dL — ABNORMAL HIGH (ref 70–99)
Potassium: 4 mmol/L (ref 3.5–5.1)
Sodium: 129 mmol/L — ABNORMAL LOW (ref 135–145)

## 2024-07-29 LAB — CBC
HCT: 32.3 % — ABNORMAL LOW (ref 39.0–52.0)
Hemoglobin: 12 g/dL — ABNORMAL LOW (ref 13.0–17.0)
MCH: 33.2 pg (ref 26.0–34.0)
MCHC: 37.2 g/dL — ABNORMAL HIGH (ref 30.0–36.0)
MCV: 89.5 fL (ref 80.0–100.0)
Platelets: 194 K/uL (ref 150–400)
RBC: 3.61 MIL/uL — ABNORMAL LOW (ref 4.22–5.81)
RDW: 12 % (ref 11.5–15.5)
WBC: 5.4 K/uL (ref 4.0–10.5)
nRBC: 0 % (ref 0.0–0.2)

## 2024-07-29 LAB — VITAMIN D 25 HYDROXY (VIT D DEFICIENCY, FRACTURES): Vit D, 25-Hydroxy: 18.7 ng/mL — ABNORMAL LOW (ref 30–100)

## 2024-07-29 MED ORDER — METHOCARBAMOL 500 MG PO TABS
500.0000 mg | ORAL_TABLET | Freq: Three times a day (TID) | ORAL | 0 refills | Status: AC | PRN
  Filled 2024-07-29: qty 15, 5d supply, fill #0

## 2024-07-29 MED ORDER — ASPIRIN 81 MG PO TBEC
81.0000 mg | DELAYED_RELEASE_TABLET | Freq: Every day | ORAL | Status: DC
  Administered 2024-07-29 – 2024-07-30 (×2): 81 mg via ORAL
  Filled 2024-07-29 (×2): qty 1

## 2024-07-29 MED ORDER — ASPIRIN 81 MG PO TBEC: 81.0000 mg | DELAYED_RELEASE_TABLET | Freq: Every day | ORAL | 0 refills | Status: DC

## 2024-07-29 MED ORDER — PHENYLEPHRINE HCL-NACL 20-0.9 MG/250ML-% IV SOLN
INTRAVENOUS | Status: DC | PRN
  Administered 2024-07-29: 30 ug/min via INTRAVENOUS

## 2024-07-29 MED ORDER — LATANOPROSTENE BUNOD 0.024 % OP SOLN: 1.0000 [drp] | Freq: Every day | OPHTHALMIC | Status: DC

## 2024-07-29 MED ORDER — PHENYLEPHRINE 80 MCG/ML (10ML) SYRINGE FOR IV PUSH (FOR BLOOD PRESSURE SUPPORT)
PREFILLED_SYRINGE | INTRAVENOUS | Status: DC | PRN
  Administered 2024-07-29 (×2): 120 ug via INTRAVENOUS

## 2024-07-29 MED ORDER — ORAL CARE MOUTH RINSE: 15.0000 mL | Freq: Once | OROMUCOSAL | Status: AC

## 2024-07-29 MED ORDER — DOCUSATE SODIUM 100 MG PO CAPS
100.0000 mg | ORAL_CAPSULE | Freq: Two times a day (BID) | ORAL | Status: DC
  Administered 2024-07-29 – 2024-07-30 (×3): 100 mg via ORAL
  Filled 2024-07-29 (×3): qty 1

## 2024-07-29 MED ORDER — ONDANSETRON HCL 4 MG/2ML IJ SOLN
INTRAMUSCULAR | Status: DC | PRN
  Administered 2024-07-29: 4 mg via INTRAVENOUS

## 2024-07-29 MED ORDER — FENTANYL CITRATE (PF) 100 MCG/2ML IJ SOLN
INTRAMUSCULAR | Status: AC
  Filled 2024-07-29: qty 2

## 2024-07-29 MED ORDER — METOCLOPRAMIDE HCL 5 MG PO TABS: 5.0000 mg | ORAL_TABLET | Freq: Three times a day (TID) | ORAL | Status: DC | PRN

## 2024-07-29 MED ORDER — FENTANYL CITRATE (PF) 100 MCG/2ML IJ SOLN
INTRAMUSCULAR | Status: DC | PRN
  Administered 2024-07-29 (×2): 50 ug via INTRAVENOUS

## 2024-07-29 MED ORDER — PROPOFOL 10 MG/ML IV BOLUS
INTRAVENOUS | Status: DC | PRN
  Administered 2024-07-29: 30 mg via INTRAVENOUS

## 2024-07-29 MED ORDER — POLYETHYLENE GLYCOL 3350 17 G PO PACK: 17.0000 g | PACK | Freq: Every day | ORAL | Status: DC | PRN

## 2024-07-29 MED ORDER — PROPOFOL 500 MG/50ML IV EMUL
INTRAVENOUS | Status: DC | PRN
  Administered 2024-07-29: 150 ug/kg/min via INTRAVENOUS

## 2024-07-29 MED ORDER — METOPROLOL SUCCINATE ER 25 MG PO TB24
25.0000 mg | ORAL_TABLET | Freq: Every day | ORAL | Status: DC
  Administered 2024-07-30: 25 mg via ORAL
  Filled 2024-07-29: qty 1

## 2024-07-29 MED ORDER — LACTATED RINGERS IV SOLN: INTRAVENOUS | Status: DC

## 2024-07-29 MED ORDER — EPHEDRINE SULFATE-NACL 50-0.9 MG/10ML-% IV SOSY
PREFILLED_SYRINGE | INTRAVENOUS | Status: DC | PRN
  Administered 2024-07-29 (×2): 5 mg via INTRAVENOUS

## 2024-07-29 MED ORDER — OXYCODONE HCL 5 MG PO TABS: 5.0000 mg | ORAL_TABLET | Freq: Once | ORAL | Status: DC | PRN

## 2024-07-29 MED ORDER — CHLORHEXIDINE GLUCONATE 0.12 % MT SOLN
15.0000 mL | Freq: Once | OROMUCOSAL | Status: AC
  Administered 2024-07-29: 15 mL via OROMUCOSAL
  Filled 2024-07-29: qty 15

## 2024-07-29 MED ORDER — HYDROMORPHONE HCL 1 MG/ML IJ SOLN: 0.2500 mg | INTRAMUSCULAR | Status: DC | PRN

## 2024-07-29 MED ORDER — OXYCODONE HCL 5 MG PO TABS
5.0000 mg | ORAL_TABLET | Freq: Four times a day (QID) | ORAL | 0 refills | Status: AC | PRN
  Filled 2024-07-29: qty 28, 7d supply, fill #0

## 2024-07-29 MED ORDER — OXYCODONE HCL 5 MG PO TABS
2.5000 mg | ORAL_TABLET | ORAL | Status: DC | PRN
  Administered 2024-07-29 – 2024-07-30 (×3): 5 mg via ORAL
  Filled 2024-07-29 (×3): qty 1

## 2024-07-29 MED ORDER — DEXAMETHASONE SOD PHOSPHATE PF 10 MG/ML IJ SOLN
INTRAMUSCULAR | Status: DC | PRN
  Administered 2024-07-29: 8 mg via INTRAVENOUS

## 2024-07-29 MED ORDER — 0.9 % SODIUM CHLORIDE (POUR BTL) OPTIME
TOPICAL | Status: DC | PRN
  Administered 2024-07-29: 1000 mL

## 2024-07-29 MED ORDER — METHOCARBAMOL 500 MG PO TABS
500.0000 mg | ORAL_TABLET | Freq: Three times a day (TID) | ORAL | Status: DC | PRN
  Administered 2024-07-29 – 2024-07-30 (×2): 500 mg via ORAL
  Filled 2024-07-29 (×2): qty 1

## 2024-07-29 MED ORDER — ONDANSETRON HCL 4 MG PO TABS: 4.0000 mg | ORAL_TABLET | Freq: Four times a day (QID) | ORAL | Status: DC | PRN

## 2024-07-29 MED ORDER — HYDROMORPHONE HCL 1 MG/ML IJ SOLN: 0.5000 mg | INTRAMUSCULAR | Status: DC | PRN

## 2024-07-29 MED ORDER — ROPIVACAINE HCL 5 MG/ML IJ SOLN
INTRAMUSCULAR | Status: DC | PRN
  Administered 2024-07-29: 30 mL via PERINEURAL

## 2024-07-29 MED ORDER — METHOCARBAMOL 1000 MG/10ML IJ SOLN: 500.0000 mg | Freq: Three times a day (TID) | INTRAMUSCULAR | Status: DC | PRN

## 2024-07-29 MED ORDER — OXYCODONE HCL 5 MG PO TABS: 5.0000 mg | ORAL_TABLET | Freq: Four times a day (QID) | ORAL | 0 refills | Status: DC | PRN

## 2024-07-29 MED ORDER — ACETAMINOPHEN 500 MG PO TABS
1000.0000 mg | ORAL_TABLET | Freq: Four times a day (QID) | ORAL | Status: DC
  Administered 2024-07-29 – 2024-07-30 (×4): 1000 mg via ORAL
  Filled 2024-07-29 (×4): qty 2

## 2024-07-29 MED ORDER — METOCLOPRAMIDE HCL 5 MG/ML IJ SOLN: 5.0000 mg | Freq: Three times a day (TID) | INTRAMUSCULAR | Status: DC | PRN

## 2024-07-29 MED ORDER — VITAMIN C 500 MG PO TABS
1000.0000 mg | ORAL_TABLET | Freq: Three times a day (TID) | ORAL | Status: DC
  Administered 2024-07-29 – 2024-07-30 (×3): 1000 mg via ORAL
  Filled 2024-07-29 (×3): qty 2

## 2024-07-29 MED ORDER — OXYCODONE HCL 5 MG/5ML PO SOLN: 5.0000 mg | Freq: Once | ORAL | Status: DC | PRN

## 2024-07-29 MED ORDER — CEFAZOLIN SODIUM-DEXTROSE 2-4 GM/100ML-% IV SOLN
2.0000 g | Freq: Three times a day (TID) | INTRAVENOUS | Status: AC
  Administered 2024-07-29 – 2024-07-30 (×3): 2 g via INTRAVENOUS
  Filled 2024-07-29 (×3): qty 100

## 2024-07-29 MED ORDER — CEFAZOLIN SODIUM-DEXTROSE 2-4 GM/100ML-% IV SOLN
INTRAVENOUS | Status: AC
  Filled 2024-07-29: qty 100

## 2024-07-29 MED ORDER — METHOCARBAMOL 500 MG PO TABS: 500.0000 mg | ORAL_TABLET | Freq: Three times a day (TID) | ORAL | 0 refills | Status: DC | PRN

## 2024-07-29 MED ORDER — CEFAZOLIN SODIUM-DEXTROSE 2-4 GM/100ML-% IV SOLN
2.0000 g | INTRAVENOUS | Status: AC
  Administered 2024-07-29: 2 g via INTRAVENOUS

## 2024-07-29 MED ORDER — VANCOMYCIN HCL 1000 MG IV SOLR
INTRAVENOUS | Status: AC
  Filled 2024-07-29: qty 20

## 2024-07-29 MED ORDER — ONDANSETRON HCL 4 MG/2ML IJ SOLN: 4.0000 mg | Freq: Four times a day (QID) | INTRAMUSCULAR | Status: DC | PRN

## 2024-07-29 MED ORDER — ASPIRIN 81 MG PO TBEC
81.0000 mg | DELAYED_RELEASE_TABLET | Freq: Every day | ORAL | 0 refills | Status: AC
  Filled 2024-07-29: qty 30, 30d supply, fill #0

## 2024-07-29 MED ORDER — ONDANSETRON HCL 4 MG/2ML IJ SOLN: 4.0000 mg | Freq: Once | INTRAMUSCULAR | Status: DC | PRN

## 2024-07-29 MED ORDER — ALBUMIN HUMAN 5 % IV SOLN
12.5000 g | Freq: Once | INTRAVENOUS | Status: AC
  Administered 2024-07-29: 12.5 g via INTRAVENOUS

## 2024-07-29 MED ORDER — ACETAMINOPHEN 10 MG/ML IV SOLN
INTRAVENOUS | Status: DC | PRN
  Administered 2024-07-29: 1000 mg via INTRAVENOUS

## 2024-07-29 MED ORDER — ATORVASTATIN CALCIUM 10 MG PO TABS
20.0000 mg | ORAL_TABLET | Freq: Every day | ORAL | Status: DC
  Administered 2024-07-30: 20 mg via ORAL
  Filled 2024-07-29: qty 2

## 2024-07-29 MED ORDER — VANCOMYCIN HCL 1000 MG IV SOLR
INTRAVENOUS | Status: DC | PRN
  Administered 2024-07-29: 1000 mg

## 2024-07-29 NOTE — Anesthesia Postprocedure Evaluation (Signed)
"   Anesthesia Post Note  Patient: Kenneth Romero  Procedure(s) Performed: OPEN REDUCTION INTERNAL FIXATION (ORIF) DISTAL HUMERUS FRACTURE (Left)     Patient location during evaluation: PACU Anesthesia Type: Regional and MAC Level of consciousness: awake and alert and oriented Pain management: pain level controlled Vital Signs Assessment: post-procedure vital signs reviewed and stable Respiratory status: spontaneous breathing, nonlabored ventilation and respiratory function stable Cardiovascular status: stable and blood pressure returned to baseline Postop Assessment: no apparent nausea or vomiting Anesthetic complications: no   No notable events documented.  Last Vitals:  Vitals:   07/29/24 1115 07/29/24 1130  BP: 98/68 (!) 100/59  Pulse: 60 (!) 53  Resp: (!) 21 13  Temp:    SpO2: 98% 97%    Last Pain:  Vitals:   07/29/24 1115  TempSrc:   PainSc: 0-No pain                 Terasa Orsini A.      "

## 2024-07-29 NOTE — Interval H&P Note (Signed)
 History and Physical Interval Note:  07/29/2024 7:16 AM  Kenneth Romero  has presented today for surgery, with the diagnosis of Left distal humerus fracture.  The various methods of treatment have been discussed with the patient and family. After consideration of risks, benefits and other options for treatment, the patient has consented to  Procedures: OPEN REDUCTION INTERNAL FIXATION (ORIF) DISTAL HUMERUS FRACTURE (Left) as a surgical intervention.  The patient's history has been reviewed, patient examined, no change in status, stable for surgery.  I have reviewed the patient's chart and labs.  Questions were answered to the patient's satisfaction.     Natnael Biederman P Mosiah Bastin

## 2024-07-29 NOTE — Transfer of Care (Signed)
 Immediate Anesthesia Transfer of Care Note  Patient: Kenneth Romero  Procedure(s) Performed: OPEN REDUCTION INTERNAL FIXATION (ORIF) DISTAL HUMERUS FRACTURE (Left)  Patient Location: PACU  Anesthesia Type:MAC and Regional  Level of Consciousness: drowsy  Airway & Oxygen Therapy: Patient Spontanous Breathing  Post-op Assessment: Report given to RN  Post vital signs: Reviewed and stable  Last Vitals:  Vitals Value Taken Time  BP 93/68 07/29/24 10:20  Temp 36.4 C 07/29/24 10:20  Pulse 55 07/29/24 10:24  Resp 12 07/29/24 10:24  SpO2 100 % 07/29/24 10:24  Vitals shown include unfiled device data.  Last Pain:  Vitals:   07/29/24 0602  TempSrc:   PainSc: 0-No pain         Complications: No notable events documented.

## 2024-07-29 NOTE — Anesthesia Procedure Notes (Signed)
 Anesthesia Regional Block: Interscalene brachial plexus block   Pre-Anesthetic Checklist: , timeout performed,  Correct Patient, Correct Site, Correct Laterality,  Correct Procedure, Correct Position, site marked,  Risks and benefits discussed,  Surgical consent,  Pre-op evaluation,  At surgeon's request and post-op pain management  Laterality: Left  Prep: chloraprep       Needles:  Injection technique: Single-shot  Needle Type: Echogenic Stimulator Needle     Needle Length: 10cm  Needle Gauge: 21   Needle insertion depth (cm): 6   Additional Needles:   Procedures:,,,, ultrasound used (permanent image in chart),,   Motor weakness within 5 minutes.  Narrative:  Start time: 07/29/2024 7:12 AM End time: 07/29/2024 7:10 AM Injection made incrementally with aspirations every 5 mL.  Performed by: Personally  Anesthesiologist: Jerrye Sharper, MD  Additional Notes: Timeout performed. Patient sedated. Relevant anatomy ID'd using US . Incremental 2-5ml injection of LA with frequent aspiration. Patient tolerated procedure well.

## 2024-07-29 NOTE — Discharge Instructions (Signed)
 "  Franky Light, MD Lauraine Moores PA-C [facility] [address] [phone] (tel)   [phone] (fax)                                  POST-OPERATIVE INSTRUCTIONS   WEIGHT BEARING STATUS: non-weightbearing left arm  RANGE OF MOTION/ACTIVITY:  use sling as needed for comfort  WOUND CARE Please keep splint clean dry and intact until follow-up. If your splint gets wet for any reason please contact the office immediately.  Do not stick anything down your splint such as pencils, momey, hangers to try and scratch yourself.  If you feel itchy take Benadryl as prescribed on the bottle for itching You may shower on Post-Op Day #2.  You must keep splint dry during this process and may find that a plastic bag taped around the extremity or alternatively a towel based bath may be a better option.   If you get your splint wet or if it is damaged please contact our clinic.  EXERCISES Due to your splint being in place you will not be able to bear weight through your extremity.   Please use your sling until follow-up. Please continue to work on range of motion of your fingers and stretch these multiple times a day to prevent stiffness. Please continue to ambulate and do not stay sitting or lying for too long. Perform foot and wrist pumps to assist in circulation.  DVT/PE prophylaxis: Aspirin   DIET: As you were eating previously.  Can use over the counter stool softeners and bowel preparations, such as Miralax, to help with bowel movements.  Narcotics can be constipating.  Be sure to drink plenty of fluids  REGIONAL ANESTHESIA (NERVE BLOCKS) The anesthesia team may have performed a nerve block for you if safe in the setting of your care.  This is a great tool used to minimize pain.  Typically the block may start wearing off overnight but the long acting medicine may last for 3-4 days.  The nerve block wearing off can be a challenging period but please utilize your as needed  pain medications to try and manage this period.    ICE AND ELEVATE INJURED/OPERATIVE EXTREMITY Using ice and elevating the injured extremity above your heart can help with swelling and pain control.  Icing in a pulsatile fashion, such as 20 minutes on and 20 minutes off, can be followed.   Do not place ice directly on skin. Make sure there is a barrier between to skin and the ice pack.   Using frozen items such as frozen peas works well as the conform nicely to the are that needs to be iced.  POST-OP MEDICATIONS- Multimodal approach to pain control In general your pain will be controlled with a combination of substances.  Prescriptions unless otherwise discussed are electronically sent to your pharmacy.  This is a carefully made plan we use to minimize narcotic use.                     -   Acetaminophen  - Non-narcotic pain medicine taken on a scheduled basis        -   Oxycodone  - This is a strong narcotic, to be used only on an as needed basis for pain.                  -   Robaxin -  Muscle relaxer taken  on an as needed basis for muscle spasms                  -   Gabapentin - Helps with nerve pain, such as burning or tingling feeling in the operative arm             STOP SMOKING OR USING NICOTINE PRODUCTS!!!!  As discussed nicotine severely impairs your body's ability to heal surgical and traumatic wounds but also impairs bone healing.  Wounds and bone heal by forming microscopic blood vessels (angiogenesis) and nicotine is a vasoconstrictor (essentially, shrinks blood vessels).  Therefore, if vasoconstriction occurs to these microscopic blood vessels they essentially disappear and are unable to deliver necessary nutrients to the healing tissue.  This is one modifiable factor that you can do to dramatically increase your chances of healing your injury.    (This means no smoking, no nicotine gum, patches, etc)   FOLLOW-UP If you develop a Fever (>101.5), Redness or Drainage from the  surgical incision site, please call our office to arrange for an evaluation. Please call the office to schedule a follow-up appointment for your incision check if you do not already have one, 7-10 days post-operatively.  CALL THE OFFICE WITH ANY QUESTIONS OR CONCERNS: [phone]   VISIT OUR WEBSITE FOR ADDITIONAL INFORMATION: [email or web address]    OTHER HELPFUL INFORMATION  If you had a block, it will wear off between 8-24 hrs postop typically.  This is period when your pain may go from nearly zero to the pain you would have had postop without the block.  This is an abrupt transition but nothing dangerous is happening.  You may take an extra dose of narcotic when this happens.  You may be more comfortable sleeping in a semi-seated position the first few nights following surgery.  Keep a pillow propped under the elbow and forearm for comfort.  If you have a recliner type of chair it might be beneficial.  If not that is fine too, but it would be helpful to sleep propped up with pillows behind your operated shoulder as well under your elbow and forearm.  This will reduce pulling on the suture lines.  When dressing, put your operative arm in the sleeve first.  When getting undressed, take your operative arm out last.  Loose fitting, button-down shirts are recommended.  Often in the first days after surgery you may be more comfortable keeping your operative arm under your shirt and not through the sleeve.  You may return to work/school in the next couple of days when you feel up to it.  Desk work and typing in the sling is fine.  We suggest you use the pain medication the first night prior to going to bed, in order to ease any pain when the anesthesia wears off. You should avoid taking pain medications on an empty stomach as it will make you nauseous. You should wean off your narcotic medicines as soon as you are able.  Most patients will be off or using minimal narcotics before their first postop  appointment.   Do not drink alcoholic beverages or take illicit drugs when taking pain medications.  It is against the law to drive while taking narcotics.  In some states it is against the law to drive while your arm is in a sling.   Pain medication may make you constipated.  Below are a few solutions to try in this order: Decrease the amount of pain medication if you aren't having pain.  Drink lots of decaffeinated fluids. Drink prune juice and/or each dried prunes  If the first 3 don't work start with additional solutions -   Take Colace - an over-the-counter stool softener -   Take Senokot - an over-the-counter laxative -   Take Miralax - a stronger over-the-counter laxative         "

## 2024-07-29 NOTE — Op Note (Signed)
 Orthopaedic Surgery Operative Note (CSN: [id number] ) Date of Surgery: 07/29/2024  Admit Date: 07/29/2024   Diagnoses: Pre-Op Diagnoses: Left supracondylar/intracondylar distal humerus fracture  Post-Op Diagnosis: Same  Procedures: CPT 24546-Open reduction internal fixation of left supracondylar/intracondylar distal humerus fracture CPT 25360-Left olecranon osteotomy  Surgeons : Primary: Kendal Franky SQUIBB, MD  Assistant: Lauraine Moores, PA-C  Location: OR 3   Anesthesia: Regional with MAC   Antibiotics: Ancef 2g preop with 1 gm vancomycin powder placed topically   Tourniquet time: None    Estimated Blood Loss: 150 mL  Complications:* No complications entered in OR log *   Specimens:* No specimens in log *   Implants: Implant Name Type Inv. Item Serial No. Manufacturer Lot No. LRB No. Used Action  WASHER CANN 12.7 STRL - [id number] Washer WASHER CANN 12.7 STRL  [employer]  Left 1 Implanted  Polo PT 3.4K884 - [id number] Screw SCREW CANN PT [id number]  [employer]  Left 1 Implanted  PLATE HUM EVOS 6H L 2.7X85 - [id number] Plate PLATE HUM EVOS 6H L 2.7X85  [employer]  Left 1 Implanted  SCREW CORT 3.5X15 ST EVOS - [id number] Screw SCREW CORT 3.5X15 ST EVOS  [employer]  Left 2 Implanted  SCREW LOCK ST EVOS 2.7X22 - [id number] Screw SCREW LOCK ST EVOS 2.7X22  [employer]  Left 1 Implanted  PLATE HUM EVOS 3H L 2.7X80 - [id number] Plate PLATE HUM EVOS 3H L 2.7X80  [employer]  Left 1 Implanted  SCREW CORT 3.5X20 ST EVOS - [id number] Screw SCREW CORT 3.5X20 ST EVOS  [employer]  Left 1 Implanted  SCREW CORT 3.5X22 ST EVOS - [id number] Screw SCREW CORT 3.5X22 ST EVOS  [employer]  Left 2 Implanted  SCREW CORT EVOS ST 3.5X28 - [id number] Screw SCREW CORT EVOS ST 3.5X28  [employer]  Left 1 Implanted  SCREW LOCK ST EVOS 2.7X20 -  [id number] Screw SCREW LOCK ST EVOS 2.7X20  [employer]  Left 1 Implanted  SCREW LOCK ST EVOS 2.7X70 - [id number] Screw SCREW LOCK ST EVOS 2.7X70  [employer]  Left 1 Implanted  SCREW LOCK ST EVOS 2.7X65 - [id number] Screw SCREW LOCK ST EVOS 2.7X65  [employer]  Left 1 Implanted  SCREW CORT 2.7X40 STAR T8 EVOS - [id number] Screw SCREW CORT 2.7X40 STAR T8 EVOS  [employer]  Left 1 Implanted  SCREW EVOS 2.7 X 50 LCK T8 S-T - [id number] Screw SCREW EVOS 2.7 X 50 LCK T8 S-T  [employer]  Left 1 Implanted  SCREW BONE LOCKING 2.7 X 28 - [id number] Screw SCREW BONE LOCKING 2.7 X 28  [employer]  Left 1 Implanted  SCREW LOCK ST EVOS 2.7X34 - [id number] Screw SCREW LOCK ST EVOS 2.7X34  [employer]  Left 1 Implanted     Indications for Surgery: 74 year old male who sustained a left supracondylar/intercondylar distal humerus fracture.  Due to the unstable nature of his injury I recommend proceeding with open reduction internal fixation.  Risks and benefits were discussed with the patient.  Risks include but not limited to bleeding, infection, malunion, nonunion, hardware failure, hardware irritation, nerve or blood vessel injury, posttraumatic arthritis, need for further surgery including possible total elbow arthroplasty.  DVT, even the possibility anesthetic complications.  He agreed to proceed with surgery and consent was obtained.  Operative Findings: 1.  Open reduction internal fixation of left supracondylar/intercondylar distal humerus fracture using Smith & Nephew 3.5/2.7 mm EVOS direct medial and posterior lateral distal humeral locking plates. 2.  Olecranon osteotomy to access the articular surface with fixation using a Smith & Nephew 6.5 mm cannulated screw.  Procedure: The patient was identified in the preoperative holding area. Consent was confirmed with the patient and their  family and all questions were answered. The operative extremity was marked after confirmation with the patient. he was then brought back to the operating room by our anesthesia colleagues.  He was placed under conscious sedation and moved over to a radiolucent flattop table.  He was then placed in a lateral decubitus position with his left side up.  An axillary roll was placed under his down extremity to keep pressure off his neurovascular structures.  The left upper extremity was then prepped and draped in usual sterile fashion.  A timeout was performed to verify the patient, the procedure, and the extremity.  Preoperative antibiotics were dosed.  The direct posterior approach to the elbow was made and carried down through skin and subcutaneous tissue.  I exposed the intermuscular septum both laterally and medially.  I exposed the lateral distal humerus.  There was involvement of the capitellum as well as significant comminution of the trochlea.  I then carefully dissected out the ulnar nerve medially.  I took care to preserve this throughout the case.  I then incised through the intermuscular septum and expose the medial condyle of the humerus.  I then exposed the proximal ulna and prepared for a osteotomy.  A guidewire for a 6.5 mm cannulated screws was placed down the center of the canal.  I then placed a 115 mm cannulated screw to gain purchase into the bone I removed this and then used an ACL salt to create a reverse chevron osteotomy of the olecranon.  I used an osteotome to complete the osteotomy.  I flipped the olecranon tip with the triceps attached proximally.  I then worked to clean out the hematoma the fracture site and visualized the articular involvement.  Unfortunately there was significant involvement of the trochlea.  One of the fragments was unreconstructable of the posterior aspect of it and this was removed.  The majority of it was still in place.  I was able to visualize a articular split  between the trochlea and the capitellum which I was able to reduce and hold provisionally with a 1.6 mm K wire.  There was a cortical read of the medial condyle to the humeral shaft that I was able to reduce and hold this provisionally with a K wire.  There was notable comminution along the lateral condyle but I was able to reduce and hold the intra-articular split with a reduction tenaculum and held it provisionally with another K wire.  I confirmed reduction with fluoroscopic imaging and then proceeded to place my fixation.  I first started with a posterior lateral locking plate.  I held provisionally with a K wire.  Drilled and placed 3.5 millimeter screws in the humeral shaft and then proceeded to place 2.7 mm locking screws in the distal segment to gain some purchase in the capitellum.  I then contoured the medial plate and placed it along the medial cortex and drilled and placed a nonlocking screw into the humeral shaft.  I then placed another nonlocking screw in the humeral shaft and proceeded to place locking screws across the medial condyle into  the capitellum/lateral condyle.  I then proceeded to place an independent 2.7 millimeter screw to reinforce the trochlea fixation.  All the K wires were removed and final fluoroscopic imaging was obtained.  I then reduced the olecranon osteotomy back into position.  I held it provisionally with a reduction tenaculum.  I placed the guidepin down the center of the canal and then I placed the 6.5 mm cannulated screw.  Unfortunately the tip of the guidewire fractured off while placing the screw.  I got excellent purchase however I was unable to retrieve the tip of the guidewire.  I felt that loosening the fixation of the osteotomy would compromise the integrity of the fixation and I did not want to risk that.  So I proceeded to leave the broken tip of the guidewire in place.  It was intraosseous so not causing any soft tissue damage.  Final fluoroscopic imaging was  obtained with the cannulated screw in place.  The incision was copiously irrigated.  A gram vancomycin powder was placed into the incision.  Layered closure of 2-0 Monocryl and 3-0 nylon was used to close the skin.  Sterile dressings were applied.  A well-padded long-arm splint was then placed.  The patient was then awoke from anesthesia and taken to the PACU in stable condition.  Post Op Plan/Instructions: Patient will be nonweightbearing to the left upper extremity.  He will remain in his postoperative splint for likely 1 to 2 weeks and then will transition out to start working on range of motion of the elbow.  He will be admitted for observation and pain control and therapy.  He will discharge home postoperative day 1.  I was present and performed the entire surgery.  Lauraine Moores, PA-C did assist me throughout the case. An assistant was necessary given the difficulty in approach, maintenance of reduction and ability to instrument the fracture.   Franky Light, MD [facility]

## 2024-07-29 NOTE — Plan of Care (Signed)

## 2024-07-30 ENCOUNTER — Other Ambulatory Visit (HOSPITAL_COMMUNITY): Payer: Self-pay

## 2024-07-30 DIAGNOSIS — S42402A Unspecified fracture of lower end of left humerus, initial encounter for closed fracture: Secondary | ICD-10-CM | POA: Diagnosis not present

## 2024-07-30 NOTE — Care Management Obs Status (Addendum)
 MEDICARE OBSERVATION STATUS NOTIFICATION   Patient Details  Name: Kenneth Romero MRN: [id number] Date of Birth: January 22, 1951   Medicare Observation Status Notification Given:  Yes  Pt signed notice with unaffected R hand.  Rosalva Slough [place], RN 07/30/2024, 9:37 AM

## 2024-07-30 NOTE — Discharge Summary (Signed)
 Physician Discharge Summary  Patient ID: ABDULLAHI VALLONE MRN: [id number] DOB/AGE: 02/01/1951 74 y.o.  Admit date: 07/29/2024 Discharge date: 07/30/2024  Admission Diagnoses:  Discharge Diagnoses:  Principal Problem:   Closed bicondylar fracture of distal humerus, left, initial encounter   Discharged Condition: good  Hospital Course: The patient was admitted to the hospital on 07/29/2024 and taken the operative the same day.  This was an open reduction and internal fixation of his left distal humerus.  Please see Dr. Thyra operative note for more details on the surgical procedure.  Following surgery was transferred to the cover then to the floor.  His pain is controlled.  He was seen by physical Occupational Therapy.  He is cleared for discharge on the first day after surgery  Consults: None  Significant Diagnostic Studies: None  Treatments: See operative note  Discharge Exam: Blood pressure 111/63, pulse 77, temperature 98.2 F (36.8 C), temperature source Oral, resp. rate 17, height 5' 10 (1.778 m), weight 72.6 kg, SpO2 98%. Well-appearing gentleman in no acute distress Splint on the left elbow and arm Slightly diminished but overall intact station radial, ulnar, and median nerve distributions Intact motor function in the AIN, PIN and ulnar nerves  Disposition: Home   Allergies as of 07/30/2024       Reactions   Sulfa Antibiotics Other (See Comments)   Unknown (childhood)        Medication List     TAKE these medications    acetaminophen  325 MG tablet Commonly known as: TYLENOL  Take 2 tablets (650 mg total) by mouth every 6 (six) hours as needed for headache.   aspirin  EC 81 MG tablet Take 1 tablet (81 mg total) by mouth daily. Swallow whole.   atorvastatin  20 MG tablet Commonly known as: Lipitor Take 1 tablet (20 mg total) by mouth daily.   fish oil-omega-3 fatty acids  1000 MG capsule Take 1 g by mouth daily.   Garlic 100 MG Tabs Take 1 tablet by mouth  1 day or 1 dose.   ibandronate  150 MG tablet Commonly known as: Boniva  Take 1 tablet (150 mg total) by mouth every 30 (thirty) days. Take in the morning with a full glass of water, on an empty stomach, and do not take anything else by mouth or lie down for the next 30 min.   methocarbamol  500 MG tablet Commonly known as: ROBAXIN  Take 1 tablet (500 mg total) by mouth every 8 (eight) hours as needed.   metoprolol  succinate 25 MG 24 hr tablet Commonly known as: TOPROL -XL Take 1 tablet (25 mg total) by mouth daily.   milk thistle 175 MG tablet Take 175 mg by mouth daily.   multivitamin with minerals tablet Take 1 tablet by mouth daily.   oxyCODONE  5 MG immediate release tablet Commonly known as: Roxicodone  Take 1 tablet (5 mg total) by mouth every 6 (six) hours as needed for severe pain (pain score 7-10).   vitamin C  1000 MG tablet Take 1,000 mg by mouth 3 (three) times daily.   Vyzulta  0.024 % Soln Generic drug: Latanoprostene Bunod  Place 1 drop into both eyes at bedtime.        Follow-up Information     Haddix, Franky SQUIBB, MD. Schedule an appointment as soon as possible for a visit today.   Specialty: Orthopedic Surgery Why: 1-2 weeks for wound check and repeat x-rays Contact information: [address] [place] [address] [phone]  Signed: Cordella SHAUNNA Rhein 07/30/2024, 10:34 AM

## 2024-07-30 NOTE — Discharge Planning (Signed)
 Patient alert. IV access removed. Discharge teaching given to patient. Patient verbalized understanding of teaching including medications. Medications filled at [facility] pharmacy picked up and placed with patient's belongings. Discharge summary placed in discharge packet and placed with patient's belongings. Patient will be transported home via family.

## 2024-07-30 NOTE — Plan of Care (Signed)
  Problem: Education: Goal: Knowledge of General Education information will improve Description: Including pain rating scale, medication(s)/side effects and non-pharmacologic comfort measures Outcome: Progressing   Problem: Activity: Goal: Risk for activity intolerance will decrease Outcome: Progressing   Problem: Pain Managment: Goal: General experience of comfort will improve and/or be controlled Outcome: Progressing
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
## 2024-08-01 ENCOUNTER — Encounter (HOSPITAL_COMMUNITY): Payer: Self-pay | Admitting: Student

## 2024-08-03 ENCOUNTER — Telehealth: Payer: Self-pay
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
## 2024-12-29 ENCOUNTER — Ambulatory Visit: Payer: Self-pay | Admitting: Family Medicine
